# Patient Record
Sex: Male | Born: 1965 | Race: White | Hispanic: No | Marital: Married
Health system: Southern US, Community
[De-identification: ages and names within clinical notes are randomized; demographics above are authoritative.]

---

## 1997-10-13 ENCOUNTER — Encounter: Payer: Self-pay | Admitting: Emergency Medicine

## 1997-10-13 ENCOUNTER — Emergency Department (HOSPITAL_COMMUNITY): Admission: EM | Admit: 1997-10-13 | Discharge: 1997-10-13 | Payer: Self-pay | Admitting: Emergency Medicine

## 1998-01-22 ENCOUNTER — Encounter: Payer: Self-pay | Admitting: Gastroenterology

## 1998-01-26 ENCOUNTER — Encounter: Payer: Self-pay | Admitting: Gastroenterology

## 2003-07-26 ENCOUNTER — Other Ambulatory Visit: Payer: Self-pay

## 2003-09-28 ENCOUNTER — Emergency Department (HOSPITAL_COMMUNITY): Admission: EM | Admit: 2003-09-28 | Discharge: 2003-09-28 | Payer: Self-pay | Admitting: Emergency Medicine

## 2003-10-23 ENCOUNTER — Ambulatory Visit: Payer: Self-pay | Admitting: Pain Medicine

## 2003-11-05 ENCOUNTER — Ambulatory Visit: Payer: Self-pay | Admitting: Pain Medicine

## 2003-11-25 ENCOUNTER — Observation Stay: Payer: Self-pay | Admitting: Internal Medicine

## 2003-12-09 ENCOUNTER — Ambulatory Visit: Payer: Self-pay | Admitting: Pain Medicine

## 2003-12-17 ENCOUNTER — Emergency Department: Payer: Self-pay | Admitting: Emergency Medicine

## 2003-12-21 ENCOUNTER — Emergency Department (HOSPITAL_COMMUNITY): Admission: EM | Admit: 2003-12-21 | Discharge: 2003-12-22 | Payer: Self-pay | Admitting: Emergency Medicine

## 2003-12-22 ENCOUNTER — Inpatient Hospital Stay (HOSPITAL_COMMUNITY): Admission: EM | Admit: 2003-12-22 | Discharge: 2003-12-23 | Payer: Self-pay | Admitting: Psychiatry

## 2003-12-22 ENCOUNTER — Ambulatory Visit: Payer: Self-pay | Admitting: Psychiatry

## 2003-12-24 ENCOUNTER — Ambulatory Visit: Payer: Self-pay | Admitting: Pain Medicine

## 2006-10-27 ENCOUNTER — Emergency Department (HOSPITAL_COMMUNITY): Admission: EM | Admit: 2006-10-27 | Discharge: 2006-10-27 | Payer: Self-pay | Admitting: Emergency Medicine

## 2006-10-31 ENCOUNTER — Emergency Department (HOSPITAL_COMMUNITY): Admission: EM | Admit: 2006-10-31 | Discharge: 2006-10-31 | Payer: Self-pay | Admitting: Emergency Medicine

## 2007-01-24 ENCOUNTER — Other Ambulatory Visit: Payer: Self-pay

## 2007-01-24 ENCOUNTER — Emergency Department: Payer: Self-pay

## 2007-10-13 ENCOUNTER — Emergency Department (HOSPITAL_COMMUNITY): Admission: EM | Admit: 2007-10-13 | Discharge: 2007-10-13 | Payer: Self-pay | Admitting: Emergency Medicine

## 2007-10-16 ENCOUNTER — Emergency Department (HOSPITAL_COMMUNITY): Admission: EM | Admit: 2007-10-16 | Discharge: 2007-10-16 | Payer: Self-pay | Admitting: Emergency Medicine

## 2007-10-17 ENCOUNTER — Emergency Department (HOSPITAL_COMMUNITY): Admission: EM | Admit: 2007-10-17 | Discharge: 2007-10-17 | Payer: Self-pay | Admitting: Emergency Medicine

## 2007-10-18 ENCOUNTER — Emergency Department (HOSPITAL_COMMUNITY): Admission: EM | Admit: 2007-10-18 | Discharge: 2007-10-18 | Payer: Self-pay | Admitting: Emergency Medicine

## 2007-10-21 ENCOUNTER — Emergency Department (HOSPITAL_COMMUNITY): Admission: EM | Admit: 2007-10-21 | Discharge: 2007-10-21 | Payer: Self-pay | Admitting: *Deleted

## 2007-10-22 ENCOUNTER — Ambulatory Visit (HOSPITAL_COMMUNITY): Admission: RE | Admit: 2007-10-22 | Discharge: 2007-10-22 | Payer: Self-pay | Admitting: Urology

## 2007-10-25 ENCOUNTER — Emergency Department (HOSPITAL_COMMUNITY): Admission: EM | Admit: 2007-10-25 | Discharge: 2007-10-25 | Payer: Self-pay | Admitting: Emergency Medicine

## 2007-12-12 ENCOUNTER — Emergency Department (HOSPITAL_COMMUNITY): Admission: EM | Admit: 2007-12-12 | Discharge: 2007-12-12 | Payer: Self-pay | Admitting: *Deleted

## 2007-12-17 ENCOUNTER — Telehealth: Payer: Self-pay | Admitting: Gastroenterology

## 2007-12-24 ENCOUNTER — Emergency Department (HOSPITAL_COMMUNITY): Admission: EM | Admit: 2007-12-24 | Discharge: 2007-12-24 | Payer: Self-pay | Admitting: Emergency Medicine

## 2007-12-26 DIAGNOSIS — K859 Acute pancreatitis without necrosis or infection, unspecified: Secondary | ICD-10-CM

## 2007-12-26 DIAGNOSIS — K219 Gastro-esophageal reflux disease without esophagitis: Secondary | ICD-10-CM

## 2007-12-26 DIAGNOSIS — F1910 Other psychoactive substance abuse, uncomplicated: Secondary | ICD-10-CM

## 2007-12-26 DIAGNOSIS — N200 Calculus of kidney: Secondary | ICD-10-CM | POA: Insufficient documentation

## 2007-12-26 DIAGNOSIS — K449 Diaphragmatic hernia without obstruction or gangrene: Secondary | ICD-10-CM | POA: Insufficient documentation

## 2008-01-03 ENCOUNTER — Ambulatory Visit: Payer: Self-pay | Admitting: Urology

## 2008-01-04 ENCOUNTER — Emergency Department (HOSPITAL_COMMUNITY): Admission: EM | Admit: 2008-01-04 | Discharge: 2008-01-04 | Payer: Self-pay | Admitting: Emergency Medicine

## 2008-01-06 ENCOUNTER — Emergency Department (HOSPITAL_COMMUNITY): Admission: EM | Admit: 2008-01-06 | Discharge: 2008-01-06 | Payer: Self-pay | Admitting: Emergency Medicine

## 2008-01-12 ENCOUNTER — Emergency Department: Payer: Self-pay | Admitting: Emergency Medicine

## 2008-01-19 ENCOUNTER — Emergency Department (HOSPITAL_COMMUNITY): Admission: EM | Admit: 2008-01-19 | Discharge: 2008-01-20 | Payer: Self-pay | Admitting: Emergency Medicine

## 2008-01-20 ENCOUNTER — Emergency Department: Payer: Self-pay | Admitting: Internal Medicine

## 2008-02-26 ENCOUNTER — Ambulatory Visit: Payer: Self-pay | Admitting: Gastroenterology

## 2008-03-03 ENCOUNTER — Telehealth: Payer: Self-pay | Admitting: Gastroenterology

## 2008-03-06 ENCOUNTER — Emergency Department (HOSPITAL_COMMUNITY): Admission: EM | Admit: 2008-03-06 | Discharge: 2008-03-07 | Payer: Self-pay | Admitting: Emergency Medicine

## 2008-03-07 ENCOUNTER — Encounter: Payer: Self-pay | Admitting: Gastroenterology

## 2008-03-12 ENCOUNTER — Emergency Department (HOSPITAL_COMMUNITY): Admission: EM | Admit: 2008-03-12 | Discharge: 2008-03-12 | Payer: Self-pay | Admitting: Emergency Medicine

## 2008-03-17 ENCOUNTER — Emergency Department (HOSPITAL_COMMUNITY): Admission: EM | Admit: 2008-03-17 | Discharge: 2008-03-18 | Payer: Self-pay | Admitting: Emergency Medicine

## 2008-03-18 ENCOUNTER — Emergency Department: Payer: Self-pay | Admitting: Emergency Medicine

## 2008-03-25 ENCOUNTER — Emergency Department (HOSPITAL_COMMUNITY): Admission: EM | Admit: 2008-03-25 | Discharge: 2008-03-25 | Payer: Self-pay | Admitting: Emergency Medicine

## 2008-03-27 ENCOUNTER — Ambulatory Visit: Payer: Self-pay | Admitting: Emergency Medicine

## 2008-03-27 ENCOUNTER — Inpatient Hospital Stay (HOSPITAL_COMMUNITY): Admission: EM | Admit: 2008-03-27 | Discharge: 2008-03-31 | Payer: Self-pay | Admitting: Emergency Medicine

## 2008-05-25 ENCOUNTER — Emergency Department (HOSPITAL_COMMUNITY): Admission: EM | Admit: 2008-05-25 | Discharge: 2008-05-25 | Payer: Self-pay | Admitting: Emergency Medicine

## 2008-05-31 ENCOUNTER — Emergency Department (HOSPITAL_COMMUNITY): Admission: EM | Admit: 2008-05-31 | Discharge: 2008-05-31 | Payer: Self-pay | Admitting: Emergency Medicine

## 2008-06-13 ENCOUNTER — Emergency Department (HOSPITAL_COMMUNITY): Admission: EM | Admit: 2008-06-13 | Discharge: 2008-06-13 | Payer: Self-pay | Admitting: Emergency Medicine

## 2008-06-19 ENCOUNTER — Emergency Department (HOSPITAL_COMMUNITY): Admission: EM | Admit: 2008-06-19 | Discharge: 2008-06-19 | Payer: Self-pay | Admitting: Family Medicine

## 2008-06-27 ENCOUNTER — Inpatient Hospital Stay (HOSPITAL_COMMUNITY): Admission: EM | Admit: 2008-06-27 | Discharge: 2008-06-29 | Payer: Self-pay | Admitting: Emergency Medicine

## 2008-06-28 ENCOUNTER — Ambulatory Visit: Payer: Self-pay | Admitting: *Deleted

## 2008-10-21 ENCOUNTER — Emergency Department (HOSPITAL_COMMUNITY): Admission: EM | Admit: 2008-10-21 | Discharge: 2008-10-21 | Payer: Self-pay | Admitting: Emergency Medicine

## 2008-10-23 ENCOUNTER — Emergency Department (HOSPITAL_COMMUNITY): Admission: EM | Admit: 2008-10-23 | Discharge: 2008-10-23 | Payer: Self-pay | Admitting: Emergency Medicine

## 2009-05-15 ENCOUNTER — Emergency Department (HOSPITAL_COMMUNITY): Admission: EM | Admit: 2009-05-15 | Discharge: 2009-05-16 | Payer: Self-pay | Admitting: Emergency Medicine

## 2009-05-21 ENCOUNTER — Emergency Department (HOSPITAL_COMMUNITY): Admission: EM | Admit: 2009-05-21 | Discharge: 2009-05-21 | Payer: Self-pay | Admitting: Emergency Medicine

## 2009-06-03 ENCOUNTER — Emergency Department: Payer: Self-pay | Admitting: Emergency Medicine

## 2009-06-04 ENCOUNTER — Emergency Department (HOSPITAL_COMMUNITY): Admission: EM | Admit: 2009-06-04 | Discharge: 2009-06-04 | Payer: Self-pay | Admitting: Emergency Medicine

## 2009-06-27 ENCOUNTER — Emergency Department: Payer: Self-pay | Admitting: Emergency Medicine

## 2009-06-30 ENCOUNTER — Emergency Department: Payer: Self-pay | Admitting: Emergency Medicine

## 2009-08-06 ENCOUNTER — Emergency Department: Payer: Self-pay | Admitting: Emergency Medicine

## 2009-08-12 ENCOUNTER — Emergency Department: Payer: Self-pay | Admitting: Emergency Medicine

## 2009-12-07 ENCOUNTER — Emergency Department (HOSPITAL_COMMUNITY): Admission: EM | Admit: 2009-12-07 | Discharge: 2009-12-07 | Payer: Self-pay | Admitting: Emergency Medicine

## 2009-12-12 ENCOUNTER — Emergency Department: Payer: Self-pay | Admitting: Emergency Medicine

## 2010-03-30 LAB — CBC
HCT: 40 % (ref 39.0–52.0)
Hemoglobin: 12.6 g/dL — ABNORMAL LOW (ref 13.0–17.0)
MCHC: 31.5 g/dL (ref 30.0–36.0)
MCV: 75.3 fL — ABNORMAL LOW (ref 78.0–100.0)
Platelets: 206 10*3/uL (ref 150–400)
RBC: 5.31 MIL/uL (ref 4.22–5.81)
RDW: 16.1 % — ABNORMAL HIGH (ref 11.5–15.5)
WBC: 6.8 10*3/uL (ref 4.0–10.5)

## 2010-03-30 LAB — DIFFERENTIAL
Eosinophils Absolute: 0.3 10*3/uL (ref 0.0–0.7)
Lymphocytes Relative: 31 % (ref 12–46)
Lymphs Abs: 2.1 10*3/uL (ref 0.7–4.0)
Monocytes Absolute: 0.8 10*3/uL (ref 0.1–1.0)

## 2010-03-30 LAB — BASIC METABOLIC PANEL
CO2: 26 mEq/L (ref 19–32)
Creatinine, Ser: 1.1 mg/dL (ref 0.4–1.5)
GFR calc non Af Amer: >60 mL/min (ref >60)
Potassium: 4.2 mEq/L (ref 3.5–5.1)
Sodium: 138 mEq/L (ref 135–145)

## 2010-03-30 LAB — PROTIME-INR
INR: 0.98 (ref 0.00–1.49)
Prothrombin Time: 13.2 seconds (ref 11.6–15.2)

## 2010-03-30 LAB — APTT: aPTT: 26 seconds (ref 24–37)

## 2010-04-05 LAB — RAPID URINE DRUG SCREEN, HOSP PERFORMED
Amphetamines: NOT DETECTED
Barbiturates: NOT DETECTED
Benzodiazepines: NOT DETECTED
Cocaine: POSITIVE — AB
Opiates: POSITIVE — AB
Tetrahydrocannabinol: NOT DETECTED

## 2010-04-05 LAB — CBC
HCT: 39.6 % (ref 39.0–52.0)
Hemoglobin: 13.2 g/dL (ref 13.0–17.0)
MCHC: 33.5 g/dL (ref 30.0–36.0)
MCV: 83.8 fL (ref 78.0–100.0)
RBC: 4.72 MIL/uL (ref 4.22–5.81)
WBC: 5.3 10*3/uL (ref 4.0–10.5)

## 2010-04-05 LAB — DIFFERENTIAL
Basophils Absolute: 0 10*3/uL (ref 0.0–0.1)
Eosinophils Absolute: 0.5 10*3/uL (ref 0.0–0.7)
Eosinophils Relative: 9 % — ABNORMAL HIGH (ref 0–5)
Lymphocytes Relative: 32 % (ref 12–46)
Lymphs Abs: 1.7 10*3/uL (ref 0.7–4.0)
Neutrophils Relative %: 49 % (ref 43–77)

## 2010-04-05 LAB — URINALYSIS, ROUTINE W REFLEX MICROSCOPIC
Glucose, UA: NEGATIVE mg/dL
Hgb urine dipstick: NEGATIVE
Ketones, ur: NEGATIVE mg/dL
Nitrite: NEGATIVE
pH: 5 (ref 5.0–8.0)

## 2010-04-05 LAB — URINE MICROSCOPIC-ADD ON

## 2010-04-05 LAB — COMPREHENSIVE METABOLIC PANEL
AST: 25 U/L (ref 0–37)
CO2: 25 mEq/L (ref 19–32)
Calcium: 8.6 mg/dL (ref 8.4–10.5)
Chloride: 103 mEq/L (ref 96–112)
Creatinine, Ser: 1.26 mg/dL (ref 0.4–1.5)
GFR calc non Af Amer: >60 mL/min (ref >60)
Glucose, Bld: 91 mg/dL (ref 70–99)
Total Bilirubin: 0.7 mg/dL (ref 0.3–1.2)

## 2010-04-05 LAB — LIPASE, BLOOD: Lipase: 19 U/L (ref 11–59)

## 2010-04-06 LAB — URINALYSIS, ROUTINE W REFLEX MICROSCOPIC
Bilirubin Urine: NEGATIVE
Bilirubin Urine: NEGATIVE
Glucose, UA: NEGATIVE mg/dL
Ketones, ur: NEGATIVE mg/dL
Ketones, ur: NEGATIVE mg/dL
Leukocytes, UA: NEGATIVE
Nitrite: NEGATIVE
Nitrite: NEGATIVE
Specific Gravity, Urine: 1.02 (ref 1.005–1.030)
Specific Gravity, Urine: 1.023 (ref 1.005–1.030)
Urobilinogen, UA: 1 mg/dL (ref 0.0–1.0)
pH: 6 (ref 5.0–8.0)
pH: 7.5 (ref 5.0–8.0)

## 2010-04-06 LAB — POCT I-STAT, CHEM 8
Calcium, Ion: 1.16 mmol/L (ref 1.12–1.32)
Chloride: 107 mEq/L (ref 96–112)
Creatinine, Ser: 1.1 mg/dL (ref 0.4–1.5)
Creatinine, Ser: 1.2 mg/dL (ref 0.4–1.5)
Glucose, Bld: 95 mg/dL (ref 70–99)
HCT: 45 % (ref 39.0–52.0)
Hemoglobin: 15.3 g/dL (ref 13.0–17.0)
Potassium: 3.9 mEq/L (ref 3.5–5.1)
Sodium: 143 mEq/L (ref 135–145)
TCO2: 26 mmol/L (ref 0–100)

## 2010-04-06 LAB — URINE MICROSCOPIC-ADD ON

## 2010-04-22 LAB — URINE MICROSCOPIC-ADD ON

## 2010-04-22 LAB — DIFFERENTIAL
Basophils Absolute: 0.1 10*3/uL (ref 0.0–0.1)
Basophils Relative: 2 % — ABNORMAL HIGH (ref 0–1)
Eosinophils Relative: 11 % — ABNORMAL HIGH (ref 0–5)
Lymphocytes Relative: 19 % (ref 12–46)
Lymphs Abs: 1.6 10*3/uL (ref 0.7–4.0)
Lymphs Abs: 1.6 10*3/uL (ref 0.7–4.0)
Monocytes Absolute: 0.7 10*3/uL (ref 0.1–1.0)
Monocytes Relative: 8 % (ref 3–12)

## 2010-04-22 LAB — COMPREHENSIVE METABOLIC PANEL
ALT: 19 U/L (ref 0–53)
ALT: 21 U/L (ref 0–53)
AST: 30 U/L (ref 0–37)
BUN: 6 mg/dL (ref 6–23)
CO2: 26 mEq/L (ref 19–32)
CO2: 29 mEq/L (ref 19–32)
Calcium: 8.4 mg/dL (ref 8.4–10.5)
Calcium: 9 mg/dL (ref 8.4–10.5)
Creatinine, Ser: 1.09 mg/dL (ref 0.4–1.5)
GFR calc Af Amer: >60 mL/min (ref >60)
GFR calc non Af Amer: >60 mL/min (ref >60)
GFR calc non Af Amer: >60 mL/min (ref >60)
Glucose, Bld: 95 mg/dL (ref 70–99)
Potassium: 4.5 mEq/L (ref 3.5–5.1)
Sodium: 141 mEq/L (ref 135–145)
Total Protein: 7 g/dL (ref 6.0–8.3)

## 2010-04-22 LAB — URINALYSIS, ROUTINE W REFLEX MICROSCOPIC
Hgb urine dipstick: NEGATIVE
Protein, ur: 30 mg/dL — AB
Urobilinogen, UA: 1 mg/dL (ref 0.0–1.0)

## 2010-04-22 LAB — CBC
Hemoglobin: 11 g/dL — ABNORMAL LOW (ref 13.0–17.0)
MCHC: 29.8 g/dL — ABNORMAL LOW (ref 30.0–36.0)
MCHC: 30.4 g/dL (ref 30.0–36.0)
MCV: 66 fL — ABNORMAL LOW (ref 78.0–100.0)
RBC: 5.49 MIL/uL (ref 4.22–5.81)
RBC: 6.18 MIL/uL — ABNORMAL HIGH (ref 4.22–5.81)

## 2010-04-22 LAB — GLUCOSE, CAPILLARY: Glucose-Capillary: 105 mg/dL — ABNORMAL HIGH (ref 70–99)

## 2010-04-24 IMAGING — CR DG ABDOMEN 1V
1 series · 2 of 2 positions shown · non-contrast
Comparison: none

REASON FOR EXAM: Renal calculi-lithotripsy
COMMENTS:

[Series 1: view not recorded · 0.17mm/px · 2 of 2 slices shown]
[im 1/2]
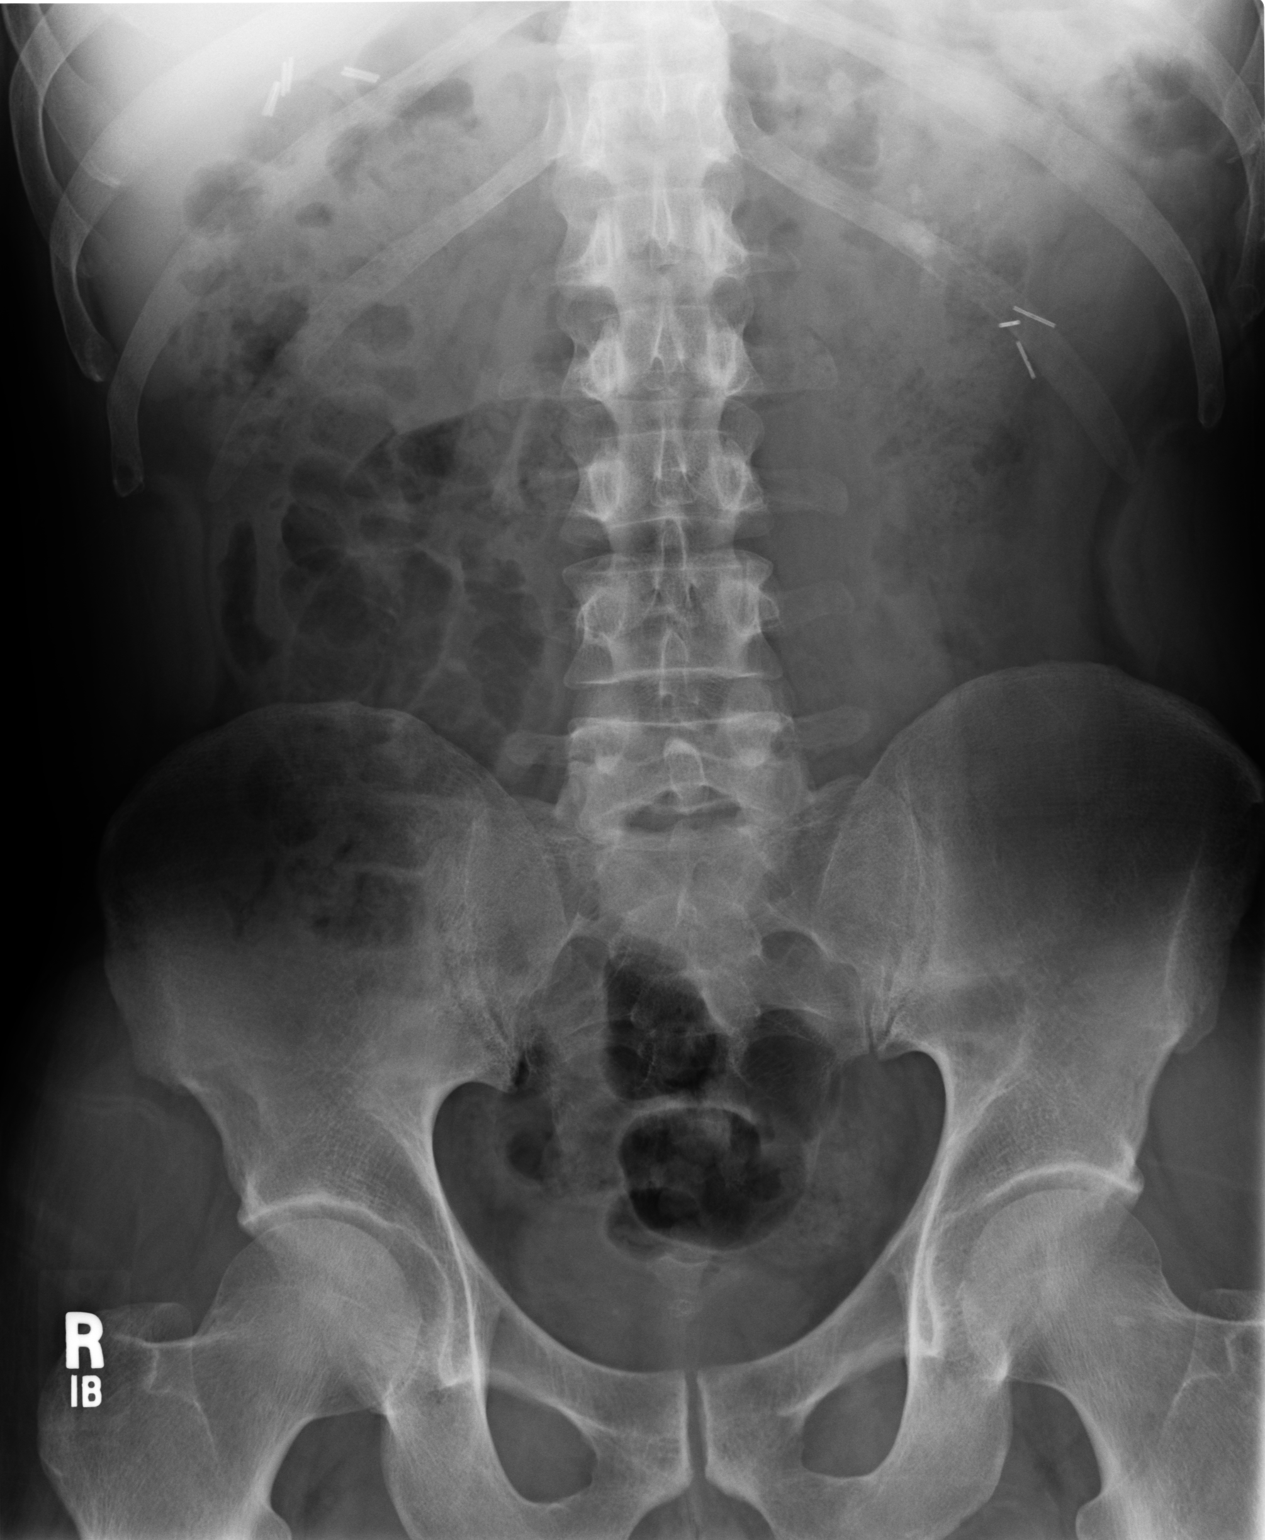
[im 2/2]
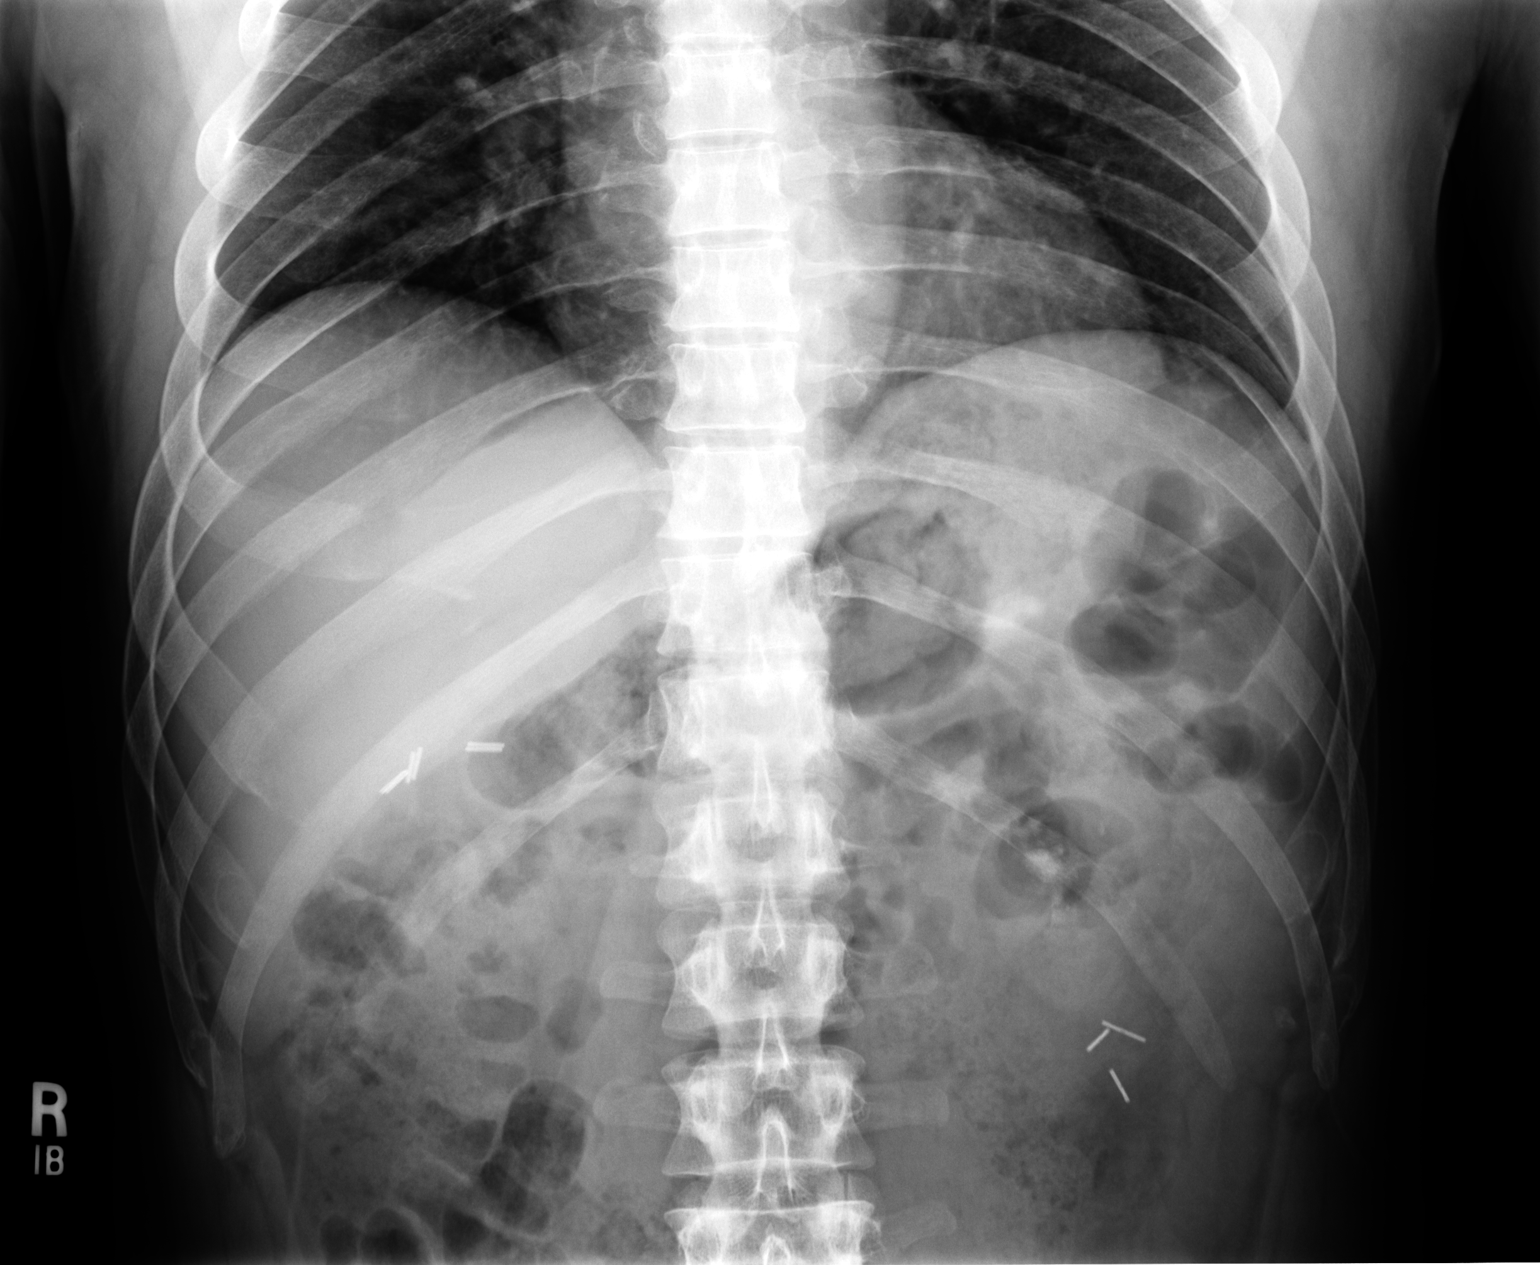

[2 of 2 positions shown; findings below may reference images not displayed]

PROCEDURE:     DXR - DXR KIDNEY URETER BLADDER  - January 03, 2008  [DATE]

RESULT:     Surgical clips project over the left mid abdomen. There are
multiple densities in the region of the left renal collecting system
consistent with multiple left renal stones. Cholecystectomy clips are
present. No definite ureteral stones or right renal calculi are evident. The
bowel gas pattern is nonspecific with some air seen in nondistended loops of
small bowel. The lung bases are clear.
IMPRESSION: Nephrolithiasis on the left.

## 2010-04-26 LAB — BASIC METABOLIC PANEL
CO2: 27 mEq/L (ref 19–32)
Chloride: 105 mEq/L (ref 96–112)
Glucose, Bld: 119 mg/dL — ABNORMAL HIGH (ref 70–99)
Potassium: 3.1 mEq/L — ABNORMAL LOW (ref 3.5–5.1)
Sodium: 139 mEq/L (ref 135–145)

## 2010-04-26 LAB — CBC
HCT: 28.5 % — ABNORMAL LOW (ref 39.0–52.0)
Hemoglobin: 9.2 g/dL — ABNORMAL LOW (ref 13.0–17.0)
Hemoglobin: 9.8 g/dL — ABNORMAL LOW (ref 13.0–17.0)
MCHC: 32.2 g/dL (ref 30.0–36.0)
MCHC: 32.2 g/dL (ref 30.0–36.0)
MCV: 73.7 fL — ABNORMAL LOW (ref 78.0–100.0)
RBC: 4.14 MIL/uL — ABNORMAL LOW (ref 4.22–5.81)
RDW: 18.2 % — ABNORMAL HIGH (ref 11.5–15.5)

## 2010-04-26 LAB — DIFFERENTIAL
Basophils Relative: 0 % (ref 0–1)
Eosinophils Absolute: 0 10*3/uL (ref 0.0–0.7)
Eosinophils Relative: 1 % (ref 0–5)
Lymphs Abs: 0.8 10*3/uL (ref 0.7–4.0)
Monocytes Relative: 5 % (ref 3–12)

## 2010-04-26 LAB — IRON AND TIBC: UIBC: 549 ug/dL

## 2010-04-26 LAB — COMPREHENSIVE METABOLIC PANEL
ALT: 15 U/L (ref 0–53)
AST: 26 U/L (ref 0–37)
Alkaline Phosphatase: 80 U/L (ref 39–117)
CO2: 27 mEq/L (ref 19–32)
Calcium: 9.4 mg/dL (ref 8.4–10.5)
GFR calc Af Amer: >60 mL/min (ref >60)
GFR calc non Af Amer: >60 mL/min (ref >60)
Potassium: 4 mEq/L (ref 3.5–5.1)
Sodium: 140 mEq/L (ref 135–145)
Total Protein: 7.3 g/dL (ref 6.0–8.3)

## 2010-04-26 LAB — URINALYSIS, ROUTINE W REFLEX MICROSCOPIC
Leukocytes, UA: NEGATIVE
Nitrite: NEGATIVE
Specific Gravity, Urine: 1.021 (ref 1.005–1.030)
Urobilinogen, UA: 0.2 mg/dL (ref 0.0–1.0)
pH: 6.5 (ref 5.0–8.0)

## 2010-04-26 LAB — GABAPENTIN LEVEL: Gabapentin Lvl: 3.7 ug/mL (ref 2.0–10.0)

## 2010-04-26 LAB — URINE MICROSCOPIC-ADD ON

## 2010-04-26 LAB — RAPID URINE DRUG SCREEN, HOSP PERFORMED
Amphetamines: NOT DETECTED
Opiates: NOT DETECTED
Tetrahydrocannabinol: NOT DETECTED

## 2010-04-26 LAB — FOLATE: Folate: >20 ng/mL

## 2010-04-26 LAB — MAGNESIUM: Magnesium: 2.3 mg/dL (ref 1.5–2.5)

## 2010-04-26 LAB — ETHANOL: Alcohol, Ethyl (B): <5 mg/dL (ref 0–10)

## 2010-04-26 LAB — T4, FREE: Free T4: 1.02 ng/dL (ref 0.80–1.80)

## 2010-04-27 LAB — COMPREHENSIVE METABOLIC PANEL
ALT: 14 U/L (ref 0–53)
ALT: 12 U/L (ref 0–53)
AST: 21 U/L (ref 0–37)
AST: 15 U/L (ref 0–37)
Albumin: 2.9 g/dL — ABNORMAL LOW (ref 3.5–5.2)
Alkaline Phosphatase: 85 U/L (ref 39–117)
Alkaline Phosphatase: 78 U/L (ref 39–117)
CO2: 28 mEq/L (ref 19–32)
Calcium: 8.9 mg/dL (ref 8.4–10.5)
Calcium: 8.6 mg/dL (ref 8.4–10.5)
GFR calc Af Amer: >60 mL/min (ref >60)
GFR calc Af Amer: >60 mL/min (ref >60)
Glucose, Bld: 96 mg/dL (ref 70–99)
Glucose, Bld: 112 mg/dL — ABNORMAL HIGH (ref 70–99)
Potassium: 3.7 mEq/L (ref 3.5–5.1)
Potassium: 3.9 mEq/L (ref 3.5–5.1)
Sodium: 138 mEq/L (ref 135–145)
Sodium: 145 mEq/L (ref 135–145)
Total Protein: 7 g/dL (ref 6.0–8.3)
Total Protein: 6.2 g/dL (ref 6.0–8.3)

## 2010-04-27 LAB — CBC
HCT: 28.9 % — ABNORMAL LOW (ref 39.0–52.0)
Hemoglobin: 10.1 g/dL — ABNORMAL LOW (ref 13.0–17.0)
MCHC: 32.1 g/dL (ref 30.0–36.0)
MCHC: 32.3 g/dL (ref 30.0–36.0)
MCV: 81.8 fL (ref 78.0–100.0)
Platelets: 277 10*3/uL (ref 150–400)
RBC: 4.14 MIL/uL — ABNORMAL LOW (ref 4.22–5.81)
RDW: 16 % — ABNORMAL HIGH (ref 11.5–15.5)

## 2010-04-27 LAB — URINE MICROSCOPIC-ADD ON

## 2010-04-27 LAB — URINALYSIS, ROUTINE W REFLEX MICROSCOPIC
Glucose, UA: NEGATIVE mg/dL
Ketones, ur: NEGATIVE mg/dL
Leukocytes, UA: NEGATIVE
Protein, ur: 30 mg/dL — AB
pH: 6 (ref 5.0–8.0)

## 2010-04-27 LAB — DIFFERENTIAL
Basophils Relative: 1 % (ref 0–1)
Basophils Relative: 1 % (ref 0–1)
Eosinophils Absolute: 0.2 10*3/uL (ref 0.0–0.7)
Eosinophils Absolute: 0.2 10*3/uL (ref 0.0–0.7)
Eosinophils Relative: 3 % (ref 0–5)
Eosinophils Relative: 5 % (ref 0–5)
Lymphs Abs: 2.3 10*3/uL (ref 0.7–4.0)
Lymphs Abs: 1.7 10*3/uL (ref 0.7–4.0)
Monocytes Absolute: 0.5 10*3/uL (ref 0.1–1.0)
Monocytes Relative: 9 % (ref 3–12)
Monocytes Relative: 9 % (ref 3–12)
Neutrophils Relative %: 42 % — ABNORMAL LOW (ref 43–77)
Neutrophils Relative %: 53 % (ref 43–77)

## 2010-04-29 LAB — BASIC METABOLIC PANEL
BUN: 5 mg/dL — ABNORMAL LOW (ref 6–23)
CO2: 26 mEq/L (ref 19–32)
CO2: 27 mEq/L (ref 19–32)
CO2: 27 mEq/L (ref 19–32)
Chloride: 107 mEq/L (ref 96–112)
Chloride: 109 mEq/L (ref 96–112)
Chloride: 110 mEq/L (ref 96–112)
Creatinine, Ser: 0.68 mg/dL (ref 0.4–1.5)
Creatinine, Ser: 0.8 mg/dL (ref 0.4–1.5)
GFR calc Af Amer: >60 mL/min (ref >60)
GFR calc Af Amer: >60 mL/min (ref >60)
Glucose, Bld: 87 mg/dL (ref 70–99)
Potassium: 3.3 mEq/L — ABNORMAL LOW (ref 3.5–5.1)
Sodium: 140 mEq/L (ref 135–145)

## 2010-04-29 LAB — TROPONIN I: Troponin I: <0.01 ng/mL (ref 0.00–0.06)

## 2010-04-29 LAB — CARDIAC PANEL(CRET KIN+CKTOT+MB+TROPI)
CK, MB: 1.1 ng/mL (ref 0.3–4.0)
CK, MB: 1.4 ng/mL (ref 0.3–4.0)
Total CK: 293 U/L — ABNORMAL HIGH (ref 7–232)
Total CK: 317 U/L — ABNORMAL HIGH (ref 7–232)
Troponin I: 0.07 ng/mL — ABNORMAL HIGH (ref 0.00–0.06)

## 2010-04-29 LAB — DIFFERENTIAL
Basophils Absolute: 0 10*3/uL (ref 0.0–0.1)
Basophils Absolute: 0 10*3/uL (ref 0.0–0.1)
Eosinophils Relative: 1 % (ref 0–5)
Lymphocytes Relative: 35 % (ref 12–46)
Lymphocytes Relative: 8 % — ABNORMAL LOW (ref 12–46)
Lymphs Abs: 0.7 10*3/uL (ref 0.7–4.0)
Lymphs Abs: 2.1 10*3/uL (ref 0.7–4.0)
Monocytes Absolute: 0.3 10*3/uL (ref 0.1–1.0)
Neutro Abs: 3.3 10*3/uL (ref 1.7–7.7)

## 2010-04-29 LAB — POCT I-STAT 3, ART BLOOD GAS (G3+)
Acid-Base Excess: 1 mmol/L (ref 0.0–2.0)
Acid-Base Excess: 2 mmol/L (ref 0.0–2.0)
O2 Saturation: 100 %
O2 Saturation: 97 %
Patient temperature: 97.9
TCO2: 27 mmol/L (ref 0–100)
pCO2 arterial: 35.2 mmHg (ref 35.0–45.0)
pCO2 arterial: 37.7 mmHg (ref 35.0–45.0)
pH, Arterial: 7.442 (ref 7.350–7.450)

## 2010-04-29 LAB — CBC
HCT: 27.5 % — ABNORMAL LOW (ref 39.0–52.0)
HCT: 32.7 % — ABNORMAL LOW (ref 39.0–52.0)
HCT: 39.3 % (ref 39.0–52.0)
Hemoglobin: 11.1 g/dL — ABNORMAL LOW (ref 13.0–17.0)
Hemoglobin: 9.7 g/dL — ABNORMAL LOW (ref 13.0–17.0)
MCHC: 33 g/dL (ref 30.0–36.0)
MCHC: 33.1 g/dL (ref 30.0–36.0)
MCHC: 33.5 g/dL (ref 30.0–36.0)
MCHC: 33.8 g/dL (ref 30.0–36.0)
MCV: 84.4 fL (ref 78.0–100.0)
MCV: 84.6 fL (ref 78.0–100.0)
MCV: 85.3 fL (ref 78.0–100.0)
MCV: 85.8 fL (ref 78.0–100.0)
Platelets: 130 10*3/uL — ABNORMAL LOW (ref 150–400)
Platelets: 156 10*3/uL (ref 150–400)
Platelets: 157 10*3/uL (ref 150–400)
Platelets: 222 10*3/uL (ref 150–400)
RBC: 3.37 MIL/uL — ABNORMAL LOW (ref 4.22–5.81)
RBC: 3.84 MIL/uL — ABNORMAL LOW (ref 4.22–5.81)
RBC: 4.61 MIL/uL (ref 4.22–5.81)
RDW: 16.6 % — ABNORMAL HIGH (ref 11.5–15.5)
WBC: 5.1 10*3/uL (ref 4.0–10.5)
WBC: 6.1 10*3/uL (ref 4.0–10.5)
WBC: 7.3 10*3/uL (ref 4.0–10.5)
WBC: 8.8 10*3/uL (ref 4.0–10.5)

## 2010-04-29 LAB — TRIGLYCERIDES: Triglycerides: 237 mg/dL — ABNORMAL HIGH (ref <150)

## 2010-04-29 LAB — VITAMIN B12: Vitamin B-12: 342 pg/mL (ref 211–911)

## 2010-04-29 LAB — PROTIME-INR
INR: 1 (ref 0.00–1.49)
Prothrombin Time: 13.4 seconds (ref 11.6–15.2)

## 2010-04-29 LAB — COMPREHENSIVE METABOLIC PANEL
AST: 24 U/L (ref 0–37)
Albumin: 3.5 g/dL (ref 3.5–5.2)
BUN: 4 mg/dL — ABNORMAL LOW (ref 6–23)
CO2: 29 mEq/L (ref 19–32)
Calcium: 9.5 mg/dL (ref 8.4–10.5)
Chloride: 100 mEq/L (ref 96–112)
Chloride: 105 mEq/L (ref 96–112)
Creatinine, Ser: 0.8 mg/dL (ref 0.4–1.5)
Creatinine, Ser: 0.86 mg/dL (ref 0.4–1.5)
GFR calc Af Amer: >60 mL/min (ref >60)
GFR calc non Af Amer: >60 mL/min (ref >60)
Sodium: 139 mEq/L (ref 135–145)
Total Bilirubin: 0.8 mg/dL (ref 0.3–1.2)
Total Bilirubin: 0.9 mg/dL (ref 0.3–1.2)

## 2010-04-29 LAB — AMMONIA: Ammonia: 18 umol/L (ref 11–35)

## 2010-04-29 LAB — RAPID URINE DRUG SCREEN, HOSP PERFORMED
Amphetamines: NOT DETECTED
Barbiturates: NOT DETECTED
Benzodiazepines: POSITIVE — AB
Benzodiazepines: POSITIVE — AB
Cocaine: NOT DETECTED
Tetrahydrocannabinol: NOT DETECTED

## 2010-04-29 LAB — POCT CARDIAC MARKERS

## 2010-04-29 LAB — POCT I-STAT 3, VENOUS BLOOD GAS (G3P V)
TCO2: 23 mmol/L (ref 0–100)
pH, Ven: 7.442 — ABNORMAL HIGH (ref 7.250–7.300)

## 2010-04-29 LAB — ACETAMINOPHEN LEVEL
Acetaminophen (Tylenol), Serum: <10 ug/mL — ABNORMAL LOW (ref 10–30)
Acetaminophen (Tylenol), Serum: <10 ug/mL — ABNORMAL LOW (ref 10–30)

## 2010-04-29 LAB — GLUCOSE, CAPILLARY
Glucose-Capillary: 100 mg/dL — ABNORMAL HIGH (ref 70–99)
Glucose-Capillary: 72 mg/dL (ref 70–99)

## 2010-04-29 LAB — MAGNESIUM: Magnesium: 1.9 mg/dL (ref 1.5–2.5)

## 2010-04-29 LAB — CK TOTAL AND CKMB (NOT AT ARMC): Relative Index: 0.4 (ref 0.0–2.5)

## 2010-04-29 LAB — CHOLESTEROL, TOTAL: Cholesterol: 131 mg/dL (ref 0–200)

## 2010-04-29 LAB — FOLATE: Folate: 14.3 ng/mL

## 2010-05-03 LAB — URINALYSIS, ROUTINE W REFLEX MICROSCOPIC
Glucose, UA: NEGATIVE mg/dL
Leukocytes, UA: NEGATIVE
Nitrite: NEGATIVE
Protein, ur: NEGATIVE mg/dL
pH: 7 (ref 5.0–8.0)

## 2010-05-03 LAB — POCT I-STAT, CHEM 8
Calcium, Ion: 1.12 mmol/L (ref 1.12–1.32)
Chloride: 104 mEq/L (ref 96–112)
HCT: 43 % (ref 39.0–52.0)
Hemoglobin: 14.6 g/dL (ref 13.0–17.0)
Potassium: 3 mEq/L — ABNORMAL LOW (ref 3.5–5.1)

## 2010-05-03 LAB — URINE MICROSCOPIC-ADD ON

## 2010-05-04 LAB — URINALYSIS, ROUTINE W REFLEX MICROSCOPIC
Ketones, ur: NEGATIVE mg/dL
Leukocytes, UA: NEGATIVE
Nitrite: NEGATIVE
Urobilinogen, UA: 0.2 mg/dL (ref 0.0–1.0)
pH: 5 (ref 5.0–8.0)

## 2010-05-04 LAB — URINE MICROSCOPIC-ADD ON

## 2010-06-01 NOTE — Discharge Summary (Signed)
[name], [name]                ACCOUNT NO.:  [id number]   MEDICAL RECORD NO.:  [id number]          PATIENT TYPE:  INP   LOCATION:  5507                         FACILITY:  [facility]   PHYSICIAN:  Altha Harm, MDDATE OF BIRTH:  [birth year]   DATE OF ADMISSION:  06/27/2008  DATE OF DISCHARGE:  06/29/2008                               DISCHARGE SUMMARY   DISCHARGE DISPOSITION:  Home.   FINAL DISCHARGE DIAGNOSES:  1. Neurontin toxicity.  2. Movement disorder.  3. Posttraumatic stress disorder.  4. Chronic pain.  5. Anxiety.  6. Depression.   DISCHARGE MEDICATIONS:  Include the following:  1. Paxil 30 mg p.o. daily.  2. Prilosec 20 mg p.o. daily.  3. Soma 350 mg p.o. b.i.d.  4. Tramadol 50 mg p.o. b.i.d.   CONSULTATIONS:  Jasmine Pang, M.D., psychiatry.   PROCEDURES:  None.   DIAGNOSTIC STUDIES:  A CT scan of the head without contrast on admission  which showed no acute intracranial abnormalities.   ALLERGIES:  1. OPIATE NARCOTICS.  2. DOXYCYCLINE.  3. DILANTIN.   CODE STATUS:  Full code.   PRIMARY CARE PHYSICIAN:  Unassigned to [place] here in [place]. The  patient sees Dr. Timoteo Gaul at [facility].   CHIEF COMPLAINT:  Abnormal movement x1 day.   HISTORY OF PRESENT ILLNESS:  Please refer to the H and P by Dr. Orvan Falconer  for details of the HPI.   HOSPITAL COURSE:  The patient was admitted to the hospital with abnormal  movement.  He was placed on telemetry for observation for any cardiac  causes. However, the patient displayed no arrhythmias.  It was felt that  this was secondary to chronic Neurontin toxicity, as the patient had  been taking in excess of 7000 mg daily. The Neurontin was discontinued.  I consulted Dr. Milford Cage in light of the fact that the patient does  have a history of PTSD, anxiety, and depression, for assistance with  medication.  Dr. Katrinka Blazing agreed with discontinuing the Neurontin, and she  also recommended that Paxil be  continued at a reduced dose of 30 mg  daily.  She also recommended that the patient follow up in outpatient  psychiatry for further evaluation for bipolar disorder and for  management of his medications. The patient had been seeing a  psychiatrist in the [facility] region, Dr. Imogene Burn, but he states that  he does not want to return to his care.  The patient has been offered  the option of following up in [facility],  and the number has been provided to the patient in the form of [phone].  The patient today is without any abnormalities of movement. He is  ambulating without any difficulty.  The patient is alert and oriented  x3.  He has good insight and cognition and is lucid in his thought  process.  And he had no physical abnormalities on examination.   Physical limitations are none.   Dietary limitations are none.   FOLLOW UP:  The patient is to follow up with Dr. Timoteo Gaul, his primary  care physician, as needed and with [facility] within 3-5 days.   Total time for this discharge process 37 minutes.      Altha Harm, MD  Electronically Signed     MAM/MEDQ  D:  06/29/2008  T:  06/29/2008  Job:  [id number]   cc:   Donella Stade, M.D.

## 2010-06-01 NOTE — Discharge Summary (Signed)
Peter [name], Peter [name]                ACCOUNT NO.:  [id number]   MEDICAL RECORD NO.:  [id number]          [name] TYPE:  INP   LOCATION:  3030                         FACILITY:  [facility]   PHYSICIAN:  Felipa Evener, MD  DATE OF BIRTH:  1965-02-17   DATE OF ADMISSION:  03/27/2008  DATE OF DISCHARGE:  03/31/2008                               DISCHARGE SUMMARY   DISCHARGE DIAGNOSES:  1. Ventilatory dependent respiratory failure secondary to acute      altered mental status.  2. Hypertension.  3. Anemia.   HISTORY OF PRESENT ILLNESS:  Peter [name] is a 45 year old male with  significant history of substance abuse, chronic pancreatitis, and  depression who presented to Peter ER on 03/11 after several previous  visits to Peter ER with complaints of tremors, nausea, not feeling well,  and complaint of withdrawal.  Peter [name] presented again on 03/11 acutely  accompanied by Peter [name] family who stated earlier in Peter day Peter [name] complained of  not feeling well, of tremors, nausea, and they had noticed progressive  altered mental status.  On initial arrival to Peter ER, Peter [name] was found to be  unresponsive and obtunded and required immediate intubation.  It was  unknown by Peter family whether Peter [name] had taken an overdose of any medication  or not but did state that Peter [name] has been withdrawing over Peter past several  days from polysubstance abuse.   LABORATORY DATA:  Triglycerides 90, phosphorus 3.2, magnesium 1.9.  Basic metabolic panel:  Sodium 148, potassium 3.7, glucose 93, BUN 6,  creatinine 0.68.  CBC:  White blood cells 4.8, hemoglobin 9.7,  hematocrit 28.5, platelets 140.  TSH 0.341.  Syphilis screen:  RPR was  nonreactive.  Urine drug screen was positive for benzos. Cardiac panel:  CK-MB less than 1, troponin less than 0.5.  Aspirin level was less than  4.  Tylenol level was less than 10.   RADIOLOGY DATA:  Portable chest x-ray on admission showed stable lungs,  no pneumothorax, no pulmonary edema or acute air space  opacity.  CT head  on admission showed no evidence for acute infarct.   PROCEDURES:  Peter [name] had a left IJ triple lumen central line placed  on 03/12 for IV access and assessment of intravascular volume mass and  this was removed on 03/13.  Peter [name] also had a right radial A-line  placed on 03/12 for assessment of blood pressure and frequent  blood  sampling and this was removed on 03/13.   HOSPITAL COURSE BY DISCHARGE DIAGNOSES:  1. Vent dependent respiratory failure secondary to altered mental      status.  Peter [name] did require intubation on admission and was      intubated for overnight from 03/12 early in Peter morning until 03/12      afternoon.  This was felt to be strictly related to obtundation      secondary to altered mental status.  Unknown, however is whether      this was due to withdrawal syndrome versus any questionable      overdose.  Again, urine drug screen  was positive only for benzos.      Dr. Jeanie Sewer of psychiatry was consulted during this admission and      it was felt that this was not a suicide attempt.  Apparently, Peter      [name] did tell Peter [name] mother that Peter [name] wanted to die but this was a      figure of speech during Peter [name] episodes of nausea and vomiting.  Dr.      Jeanie Sewer recommended Paxil, Abilify, hydroxyzine as needed for      acute anxiety, and recommended that Peter [name] attend intensive      outpatient substance abuse counseling.  Peter [name]'s respiratory      status quickly improved once Peter [name] was extubated.  Peter [name] quickly      saturated well on room air once Peter [name] mental status improved as did      Peter [name] respiratory status.  Peter vent dependent respiratory failure was      resolved at Peter time of discharge.  2. Hypertension.  Peter [name]'s hypertension was improved with      medications during Peter [name] admission and Peter [name] was discharged on      lisinopril.  This is a chronic problem for this [name] and Peter [name] was      to follow up with Peter [name] primary care  physician on an outpatient      basis.  3. Anemia.  This was of unknown etiology, however, it was not severe.      Peter [name] hemoglobin prior to discharge was 9.7.  Peter [name] did not have any      active bleeding.  Peter [name] was on subcu heparin for DVT      prophylaxis during this admission.  However, Peter [name] hemoglobin on      admission was 13.0.  a HIT panel was done on 04/13 was negative for      heparin antibody.  Peter [name] heparin was stopped and Peter [name] was switched PAS      hose for DVT prophylaxis.  Again, this was an unknown etiology and      it was not severe.  Peter [name] had no active bleeding.  This was      to be followed up on an outpatient basis.   DISCHARGE MEDICATIONS:  1. Paxil 20 mg q.a.m., then titrating over Peter next 7 days up to 60 mg      p.o. q.a.m.  2. Prilosec p.o. daily.  3. Lisinopril 10 mg p.o. daily.  4. Alprazolam 1 mg p.o. b.i.d. as needed.  5. Pancrease 2 caps p.o. before meals.   DISCHARGE DIET:  Peter [name] was discharged on a regular diet.   ACTIVITY:  Peter [name] was to increase activity as tolerated.   FOLLOWUP:  Peter [name] was to follow up with Peter [name] primary care physician,  as well as attend intensive outpatient substance abuse counseling.   Peter [name] was discharged home.  Peter [name] vent respiratory failure was  resolved.  Peter [name] altered mental status was resolved.  Peter [name] polysubstance  abuse is an ongoing issue when Peter [name] spoke with psychiatry during  this admission and was willing to attend  intensive outpatient therapy.      Dirk Dress, NP      Peter Ditch Jefm Miles, MD  Electronically Signed    KW/MEDQ  D:  05/14/2008  T:  05/14/2008  Job:  [id number]   cc:   Antonietta Breach, M.D.

## 2010-06-01 NOTE — Consult Note (Signed)
NAMECRUZE, ZINGARO NO.:  [id number]   MEDICAL RECORD NO.:  [id number]          PATIENT TYPE:  INP   LOCATION:  2108                         FACILITY:  [facility]   PHYSICIAN:  Antonietta Breach, M.D.  DATE OF BIRTH:  Jan 13, 1966   DATE OF CONSULTATION:  03/28/2008  DATE OF DISCHARGE:                                 CONSULTATION   REQUESTING PHYSICIAN:  Charlaine Dalton. Sherene Sires, MD, FCCP.   REASON FOR CONSULTATION:  Delirium, substance withdrawal, overdose,  suicide attempt.   HISTORY OF PRESENT ILLNESS:  Mr. [name] is a 45 year old male  admitted to the [facility] on March 27, 2008 for acute mental status  changes.  [name] presented to the emergency department on March 10  with tremor.  He was given an Ativan withdrawal taper, which was  administered by his wife correctly.  However, his father found him on  the day of admission comatose and activated emergency services.  His  wife noted that an Ultram prescription had just been filled at a  pharmacy and she could not find the bottle.  [name] required  intubation after he became severely confused, agitated and combative  when coming out of the severe obtundation.   He had been treated for depression in an outpatient clinic most recently  with Paxil.  His depression continued and Abilify was added 2 days prior  to admission.  [name] told his mother that he wanted to die.  He  also had reported suicidal ideation.  Currently [name] is not  showing any autonomic signs of withdrawal and yet he does continue with  disorientation and intermittent thought disorganization as well as some  clouding of consciousness.  He also has inappropriate smiling.  Please  see the mental status exam.   He does have a drop in his hemoglobin from 13 on March 9 to 9.7  currently while his BUN and creatinine have remained stable.  He has a  long-term history of opioid dependence and has been continuing to use  large  quantities of opiates.   PAST PSYCHIATRIC HISTORY:  [name]' wife describes some nights of the  patient needing very little sleep and staying up out of bed.  She does  not believe these periods have lasted more than 3 days.  [name] does  have a history of depression.   In review of the past medical record, [name] was admitted to the  [facility] center in December 2005.  At that time he was  going through opioid withdrawal and was placed on an opioid withdrawal  protocol.  He also was treated with Lamictal and Seroquel.  His wife  states he has been off of these for years.  Please see the above  history.   FAMILY PSYCHIATRIC HISTORY:  None known.   SOCIAL HISTORY:  [name] does have a college degree in public  administration.  He has worked in [employer].  He is currently  disabled.  He is married with three children.  Please see the above  regarding opioid dependence.  PAST MEDICAL HISTORY:  1. Chronic pancreatitis.  2. Hypertension.   MEDICATIONS:  The MAR is reviewed.  He is on a Fentanyl 25-100 mcg every  1 hour p.r.n.   ALLERGIES:  HE HAS ALLERGIES TO DILANTIN, VIBRAMYCIN, MORPHINE SULFATE  AND SULFA.   LABORATORY DATA:  Please see the above regarding the hemoglobin.  His  BUN on March 9 was 4, creatinine was 0.86.  Current BUN 6, creatinine  0.8.  WBC 7.3, hemoglobin 9.7, platelet count 157.  Sodium 140, glucose 85.  Tylenol, aspirin, ammonia, SGOT, SGPT all unremarkable.  Urine drug  screen positive for benzodiazepines.  Head CT without contrast was  unremarkable.  EKG QTC on March 11:  477 milliseconds.   REVIEW OF SYSTEMS:  [name] cannot provide much of this.  It is  gleaned from his wife, the staff, the medical record and the electronic  medical record.  CONSTITUTIONAL, HEAD, EYES, EARS, NOSE AND THROAT, MOUTH, NEUROLOGIC,  PSYCHIATRIC, CARDIOVASCULAR, RESPIRATORY, GASTROINTESTINAL,  GENITOURINARY, SKIN, MUSCULOSKELETAL,  HEMATOLOGIC, LYMPHATIC, ENDOCRINE,  METABOLIC:  All unremarkable.   EXAMINATION:  VITAL SIGNS:  Temperature 98.3, pulse 97, respiratory rate  21, blood pressure 123/86.  He does not have any tremor or sweating.  Bilateral passive range of motion.  Examination of the elbows:  No  cogwheeling or rigidity.  GENERAL APPEARANCE:  [name] is a young male lying in a supine  position in his intensive care unit bed with no abnormal involuntary  movements.   MENTAL STATUS EXAM:  [name] has clouding of consciousness and  inappropriate smiling.  His affect involves inappropriate smiling.  His  mood is involving inappropriate smiling.  His attention span is  decreased.  Concentration is decreased.  On orientation testing he does  not know the year, the month or the place.  He states that he is at  [facility].  He is oriented to person.  He also recognizes his wife and  daughter.  Memory is impaired.  Fund of knowledge and intelligence are  below that of his estimated premorbid baseline.  His speech is soft with  a slightly flat prosody.  There is no dysarthria.  Thought process  involves intermittent disorganization.  Thought content:  He does not  have any hallucinations.  He currently is not having thoughts of harming  himself or others and he is cooperative with bedside care.  However, his  memory ability is impaired and he is a poor historian.  His insight is  poor.  His judgment is impaired.   ASSESSMENT:  Axis I:  293.00  Delirium not otherwise specified.  If [name] did have  benzodiazepine tolerance and was started on an Ativan taper on the 10th,  he could still go into withdrawal very easily now unless his overdose  involved a benzodiazepine.  Therefore his delirium can be secondary to  other factors.  293.83  Mood disorder not otherwise specified.  [name] does have a  history of depression clearly.  He may also have a history of hypomanic  or manic symptoms.  The history  details are not available at this time  given the patient's poor historical ability.  Opioid dependence, rule out benzodiazepine dependence.  Axis II:  Deferred.  Axis III:  See past medical history.  Axis IV:  General medical.  Axis V:  15.   The undersigned met with the patient's wife to gain as much history from  her as possible.   RECOMMENDATIONS:  1. To  cover the possibility of withdrawal emerging from      benzodiazepines or alcohol, would utilize Ativan 1-3 mg p.o.      intramuscular or intravenous every one hour p.r.n. withdrawal or      agitation.  2. Will not proceed with an antipsychotic yet given the risk of      lowering the seizure threshold if withdrawal is present.  However,      if his delirium symptoms continue tomorrow and there are not      concurrent signs of benzodiazepine or alcohol withdrawal, would      start Zyprexa at 10 mg p.o. or intramuscular every 16:00.  3. Would keep memory and orientation cues in the room and provide low      stimulation ego support with a quiet environment.  4. Would provide thiamine 100 mg daily to cover for the possibility of      nutritional deficiency as a factor in the delirium.  5. The undersigned will check a current hemoglobin to rule out      laboratory error.  6. Given that his delirium etiology is unclear at this point, will      order RPR, B12, folic acid and TSH.  Would consider further organic      workup if delirium persists.  7. Once medically cleared, would admit to an inpatient psychiatric      ward.  8. His long-term therapy will need to involve chemical dependency      rehabilitation as well as the 12-step method.  9. If Zyprexa is started would monitor for stiffness or other      extrapyramidal side effects.      Antonietta Breach, M.D.  Electronically Signed     JW/MEDQ  D:  03/28/2008  T:  03/28/2008  Job:  [id number]

## 2010-06-01 NOTE — H&P (Signed)
NAME:  [name], [name] NO.:  [id number]   MEDICAL RECORD NO.:  [id number]          PATIENT TYPE:  EMS   LOCATION:  MAJO                         FACILITY:  [facility]   PHYSICIAN:  Vania Rea, M.D. DATE OF BIRTH:  [birth year]   DATE OF ADMISSION:  06/27/2008  DATE OF DISCHARGE:                              HISTORY & PHYSICAL   PRIMARY CARE PHYSICIAN:  Dr. Timoteo Gaul at [facility] family practice.   CHIEF COMPLAINT:  Abnormal movements since today.   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  The  patient has been having jerking movements of his arms and legs and  acting strange.  He has not lost consciousness.  During the course of  these abnormal movements, the patient fell and hit his head and  sustained an abrasion to the right periorbital area.   The patient was brought to the emergency room by EMS.  He received 0.5  mg of Dilaudid and 2 mg of Ativan IM and calmed down somewhat.  But has  been waiting in the emergency room for some hours and now has become  agitated again, got up out of  bed walking around the hospital room and  complained of visual hallucinations and a feeling of recurring deja vu.  The patient denies any headache or fever but is complaining of a pain  which seems to be moving from the top of his spine down to his coccyx.  Wife complains that he does not sleep much.   PAST MEDICAL HISTORY:  1. History of ventilator dependent respiratory failure secondary to      acute altered mental status in May 2010.  2. Altered mental status related to possible overdose.  3. History of chronic pancreatitis.  4. Remote history of narcotic addiction and  withdrawal.   MEDICATIONS:  1. Paxil 30 mg twice daily.  2. Neurontin 1800 mg four times daily.  3. The patient briefly took Soma and tramadol for a couple days two      days ago but discontinued.  4. Prilosec 20 mg daily.   ALLERGIES:  DILANTIN, BACTRIM, SULFA, DOXYCYCLINE, MORPHINE, VICODIN AND  ALL NARCOTICS MAKE HIM PRONE TO ADDICTIONS.   SOCIAL HISTORY:  No history of tobacco, alcohol or illicit drug use.  Remote history of narcotic addiction.  He has a college degree in public  administration.  He has worked in [employer].  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   FAMILY HISTORY:  Denies any family history of any medical problems at  all.   REVIEW OF SYSTEMS:  Other than noted above episodic bright red blood  with defecation, otherwisea 10-point review of systems was unremarkable.   PHYSICAL EXAMINATION:  A somewhat  agitated young Caucasian gentleman  standing beside the bed looking somewhat bewildered.  VITALS:  Temperature is 98.0, pulse 110, respirations 20, blood pressure  120/74.  He is saturating at 99% on room air.  His pupils are round, equal and reactive.  There is no nystagmus.  He  has no cervical lymphadenopathy or thyromegaly.  He does have a linear  abrasion across his right eyebrow.  CHEST:  Clear to auscultation bilaterally.  CARDIOVASCULAR:  Regular rhythm.  No murmur heard.  ABDOMEN:  Scaphoid, soft and nontender.  No masses are felt.  EXTREMITIES:  Without edema. He has no calf tenderness.  CENTRAL NERVOUS SYSTEM:  Cranial nerves II-XII appear to be grossly  intact.  He has grade 5 power throughout and has no focal lateralizing  signs.  PSYCHIATRIC:  The patient is alert and oriented x3.   LABORATORY DATA:  White count is 5.2, hemoglobin 9.2, MCV 73.6,  platelets 228.  He has a normal differential on his white count. Sodium  is 140, potassium 4.0, chloride 106, CO2  27, glucose 115, BUN 5,  creatinine 0.89.  Calcium 9.4.  His liver function tests are completely  normal.  His lipase is 16.  His alcohol level is undetectable.  Urine  drug screen is positive for benzodiazepines but he did receive  Versed  en route to the hospital.  CT scan of the brain shows no acute  abnormality.   ASSESSMENT:  1. Acute delirium.  2. Abnormal movements.  3. Chronic pain syndrome.  4. Microcytic anemia.  5. Remote history of opioid addiction.  6. Insomnia.   PLAN:  1. Will admit this gentleman for observation and consultation with a      psychiatrist. It is likely that his symptoms are due to drug      toxicity.  He is taking an extremely large dose of Neurontin as      well as a maximal dose of Paxil.  We will continue his Paxil at 60      mg daily, but will hold or reduce his Neurontin.  2. Will give him Ambien to help with his insomnia.  However, his wife      insisted that he should not be discharged with Ambien and this has      caused a lot of problems at home in the past.  Will also give a      dose of Benadryl in case his abnormal movements are related to      Paxil interaction and will give a tapering dose of benzodiazepines.      We will do an anemia panel, but will go ahead and start him on      iron.  He does give a history of rectal bleeding at toilet. He will      probably need a GI evaluation at a later date.      Vania Rea, M.D.  Electronically Signed     LC/MEDQ  D:  06/27/2008  T:  06/27/2008  Job:  [id number]   cc:   Gelene Mink, M.D.

## 2010-06-01 NOTE — Consult Note (Signed)
NAME:  [name], [name] NO.:  [id number]   MEDICAL RECORD NO.:  [id number]          PATIENT TYPE:  INP   LOCATION:  3030                         FACILITY:  [facility]   PHYSICIAN:  Antonietta Breach, M.D.  DATE OF BIRTH:  14-Jul-1965   DATE OF CONSULTATION:  03/31/2008  DATE OF DISCHARGE:                                 CONSULTATION   [name] denies any suicide attempt.  He states that he did tell his  mother that he wanted to die.  He states that that was a figure of  speech under the influence of nausea and vomiting.   He did report to his primary care physician on Paxil 60 mg daily during  the week prior to admission.  His primary care physician prescribed  Abilify as an antidepressant augmentation.   He emphasizes that his interests were still 50% intact, his energy was  moderately decreased however he did not have any thoughts of harming  himself.  He had no hallucinations or delusions.   He still does have some feeling on edge and has been receiving Ativan  from the nurses.   He emphasizes that he is highly motivated for treatment and ongoing  care, he sites his child as well as his wife and has other interests as  well.  He has constructive future goals.  His energy is still mild to  moderately decreased, he also has mildly depressed interests and had the  same motivation for starting the Abilify that he had the week prior to  admission.   He is socially accepted and cooperative, his thought process is normal.   He does provide additional history regarding his psychotropic problems.  He denies any history of greater than 2 days of increased or decreased  need for sleep and he points out that those periods were associated with  an opioid treatment and they stopped once the opioid was stopped.   He has had multiple episodes of depression and has had multiple trials  for anti-depression including Effexor, Effexor combined with Remeron,  Wellbutrin, Cymbalta,  Seroquel and Lamictal, Paxil has worked the best  with partial improvement.  He was 60 mg Paxil daily prior to admission.   REVIEW OF SYSTEMS:  GASTROINTESTINAL:  He is not having any nausea or  vomiting and feels greatly relieved.   LABORATORY DATA:  Sodium 140, BUN 6, creatinine 0.36, glucose 93, WBC  4.8, hemoglobin 9.7, platelet count 140.  His EKG QTC on March 27, 2008  was 477 milliseconds.   MENTAL STATUS EXAM:  [name] is alert, his eye contact is good, his  attention span is normal, concentration is normal, affect is lightly  restricted at the beginning of the conversation, however, there is a  broad and appropriate range as the conversation proceeds.  Mood is  mildly depressed.  He is oriented to all spheres.  His memory is intact  to immediate, recent and remote except for the obtundation.  His speech  involves normal rate and prosody without dysarthria.  He describes some  anxious moods as well.  Thought process is  logical, coherent, goal-  directed without looseness of associations.  Thought content:  No  thoughts of harming himself, no thoughts of harming others, no delusions  or hallucinations.  Insight is intact, judgment is intact.   ASSESSMENT:  AXIS I:  1. 293.00 delirium not otherwise specified, now clear.  2. 293.83 mood disorder not otherwise specified (idiopathic and      general medical pain factors), depressed, partially improved on      Paxil.  3. 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   [name] is not at risk to harm himself or others.  He agrees to call  emergency services immediately for any thoughts of harming himself or  others or distress.   He agrees to not drive if drowsy.   The undersigned provided ego supportive psychotherapy.   The indications, alternatives and adverse effects of Paxil augmented  with Abilify were discussed with the patient including the risk of  Abilify hyperglycemia, cardiac death and  non-reversible movement  disorders as well as overall metabolic syndrome.   [name] understands and wants to proceed with Paxil augmented with  Abilify given his past history of recurrent major depressive episodes  and non-effective regimen.   RECOMMENDATIONS:  1. Paxil starting at 20 mg q.a.m. and then titrating over the next      seven days to 60 mg p.o. q.a.m.  2. Abilify 5 mg p.o. nightly.  3. For anti acute anxiety would utilize hydroxyzine 25 mg p.o. q.i.d.      p.r.n.   Eventual psychotherapy after the CDIOP that involved cognitive  behavioral therapy with deep breathing and progressive muscle relaxation  can work very well with the Paxil in treating anxiety, reducing the  patient's risk of utilizing benzodiazepines in an abusive manner.   Would ask the social worker to set up a chemical dependency intensive  outpatient program to start immediately during the week of discharge.   [name] declined a psychiatric inpatient dual diagnosis program and  he is not committable.   He is motivated for the CDIOP.   When starting the Abilify would monitor for stiffness or other  extrapyramidal side effects, would perform periodic abnormal involuntary  movement scales and hemoglobin A1c checks as well as checking periodic  lipids, particularly in the text of his pancreatitis history.      Antonietta Breach, M.D.  Electronically Signed     JW/MEDQ  D:  03/31/2008  T:  03/31/2008  Job:  [id number]

## 2010-06-04 NOTE — Discharge Summary (Signed)
NAMEMARWIN, PRIMMER NO.:  [id number]   MEDICAL RECORD NO.:  [id number]          PATIENT TYPE:  IPS   LOCATION:  0303                          [facility]   PHYSICIAN:  Jeanice Lim, M.D. DATE OF BIRTH:  05/16/1965   DATE OF ADMISSION:  12/22/2003  DATE OF DISCHARGE:  12/23/2003                                 DISCHARGE SUMMARY   IDENTIFYING DATA:  This is a 45 year old married male, voluntarily admitted  with a history of opiate dependence, had been taking Fentanyl 100 mcg and  OxyContin 40 mg b.i.d. for approximately one year.  He states he was  prescribed this medication from [facility] for chronic pancreatitis.  He stopped on his own Thursday prior to arrival but was feeling groggy  taking a patch every 2 days as recommended to help decrease pain, but  sweating too much.  He stopped his medications, having tremors, sweats,  increasing anxiety.  Took Xanax to help him through the weekend, not  sleeping well.  Hospitalized at [facility] for chronic pancreatitis  and fleeting suicide ideation.  Dr. Imogene Burn, psychiatrist in [place].  First  admission to [facility].   MEDICATIONS:  Nexium, Ambien, Neurontin and clonidine.   DRUG ALLERGIES:  DILANTIN, GIVES HIVES.   PHYSICAL EXAMINATION:  NEURO:  Within normal limits.   ROUTINE ADMITTED LABORATORY STUDIES:  Within normal limits.   MENTAL STATUS EXAM:  Alert, young male with fair eye contact, somewhat  uncomfortable.  Speech clear, anxious, felt like he was going crazy.  Somewhat anxious and agitated.  Thought processes mostly goal-directed,  perseverating, dysphoria.  Cognitively intact.  Judgment and insight were  fair.  Questionable historian.   ADMISSION DIAGNOSIS:   AXIS I:  1.  Opiate dependence.  Rule out benzodiazepine dependence versus abuse.  2.  Possible depression, not otherwise specified versus substance induced      and withdrawal state mood disorder.   AXIS II:  Deferred.   AXIS III:  Chronic. Pancreatitis.   AXIS IV:  Moderate psychosocial stressors including chronic medical  problems.   AXIS V:  Global assessment of functioning 40/60.   The patient was admitted, ordered routine and p.r.n. medications, underwent  further monitoring, was encouraged to participate in individual, group, and  milieu therapies.  The patient was stabilized on medications.  He was placed  on low-dose Librium detox protocol for possible withdrawal from Xanax and  clonidine detox for safe withdrawal off of pain patches and patient was  resumed on medical meds and psychotropics.  Pain was managed with non-  addictive medication.  Seroquel optimized.  Lamictal started gradually to  minimize risk of rash.  After discussing risk, benefit, ratio and  alternative treatments regarding medications, the patient was discharged in  improved condition.  General response to crisis intervention and medication  optimization, tolerating meds without side effects.  Mood was euthymic.  Affect full.  No dangerous ideation or psychotic symptoms.  Reported  motivation to be compliant with medications.  Aware of risks with  medications and recommendations to stop Lamictal if rash develops and call  psychiatrist.   The patient was discharged on:  1.  Lamictal 25 mg every other day for 2 weeks, then every day with the      recommendation to continue titration as indicated.  2.  Ambien 10 mg one to two q.h.s. p.r.n. insomnia.  3.  Seroquel 100 mg every 9:00 p.m.   The patient was to continue other medications as prescribed.  Avoid  continuing any prescribed narcotics any longer than absolutely necessary due  to high-risk in this particular patient and to work with doctor in gradually  tapering and stopping narcotics.   The patient was to follow up with Dr. Imogene Burn on Wednesday, 12/31/2003, at  11:30.   DISCHARGE DIAGNOSES:   AXIS I:  1.  Opiate dependence.  Rule out benzodiazepine dependence versus  abuse.  2.  Possible depression, not otherwise specified versus substance induced      and withdrawal state mood disorder.   AXIS II:  Deferred.   AXIS III:  Chronic. Pancreatitis.   AXIS IV:  Moderate psychosocial stressors including chronic medical  problems.   AXIS V:  Global assessment of functioning on discharge was 55.     Jame   JEM/MEDQ  D:  01/14/2004  T:  01/14/2004  Job:  [id number]

## 2010-06-04 NOTE — H&P (Signed)
[name], [name] NO.:  [id number]   MEDICAL RECORD NO.:  [id number]          PATIENT TYPE:  IPS   LOCATION:  0303                          [facility]ACILITY[facility]  [facility]   PHYSICIAN:  Jeanice Lim, M.D. DATE OF BIRTH:  Nov 10, 1965   DATE OF ADMISSION:  12/22/2003  DATE OF DISCHARGE:                         PSYCHIATRIC ADMISSION ASSESSMENT   IDENTIFYING INFORMATION:  This is a 45 year old married male, voluntarily  admitted on December 21, 2003.   HISTORY OF PRESENT ILLNESS:  The patient presents with a history of opiate  dependence.  The patient has been taking Fentanyl 100 mcg and OxyContin 40  mg b.i.d. for approximately one year.  The patient states that he was  prescribed this medication from [facility] for his chronic  pancreatitis.  The patient states that he stopped on his own on Thursday  prior to arrival because he was feeling very groggy.  The patient states  that he was taking the patch every two days that was recommended to help  decrease the pain because he sweats so much.  The patient states that after  he stopped his medications, he was having tremors, sweats and increasing  anxiety.  He reports that his wife gave him a few Xanax to help him through  the weekend.  He reports that he has not been sleeping well, has not had an  appetite since Friday.  The patient also reports that he has been  hospitalized approximately two weeks ago in [facility] for chronic  pancreatitis.  The patient is denying any suicidal or homicidal thoughts.   PAST PSYCHIATRIC HISTORY:  First admission to [facility].  No history  of a suicide attempt.  His psychiatrist, Dr. Imogene Burn in [place].   SOCIAL HISTORY:  This is a 45 year old, married, white male.  Married for 17  years; has three children; lives with his wife and children.  He works doing  Holiday representative.  No legal charges.  Is a Engineer, maintenance (IT).   FAMILY HISTORY:  None.   ALCOHOL/DRUG HISTORY:   Nonsmoker.  Denies any alcohol or recreational drug  use.   PRIMARY CARE Geraldo Haris:  Dr. Nanda Quinton at [facility].   PAST MEDICAL HISTORY:  1.  Chronic pancreatitis of unknown etiology.  2.  Hypertension.   MEDICATIONS:  1.  Nexium 40 mg daily.  2.  Ambien 10 at bedtime.  3.  Neurontin 600 mg t.i.d.  4.  Clonidine 0.2 one in the morning and one at bedtime.   DRUG ALLERGIES:  DILANTIN, reports he gets hives.   PHYSICAL EXAMINATION:  The patient was assessed at [facility] where he  received Ativan 4 mg p.o., Valium 10 mg p.o. and Ativan 2 mg IM.  He seemed  somewhat uncomfortable.  His temperature is 98.1; 72 heart rate; 18  respirations; blood pressure 133/72; he is 169 pounds.  His urine drug  screen is positive for benzodiazepines and barbiturates.  Glucose is 106,  RDW is 18, amylase is 114.   MENTAL STATUS EXAM:  He is an alert, young man.  Fair eye contact.  Seems to  be somewhat  uncomfortable.  Speech is clear.  Patient feels anxious, feels  like he is going to go crazy.  In fact, he gets somewhat anxious.  Thought  processes are coherent with no evidence of psychosis.  Cognitive function is  intact.  Memory is good.  Judgment and insight are fair.  Questionable  historian.   ADMISSION DIAGNOSES:   AXIS I:  1.  Opiate abuse.  2.  Rule out benzodiazepine abuse.   AXIS II:  Deferred.   AXIS III:  Chronic pancreatitis, per patient.   AXIS IV:  Other psychosocial problems, medical problems.   AXIS V:  Current is 45; past year is 28.   PLAN:  Contract for safety, stabilize mood and thinking.  We will detox the  patient from opiates by using his clonidine.  We will have Librium available  for anxiety.  We will encourage fluids.  We will monitor patient's vital  signs.  The patient is to follow-up with Dr. Imogene Burn and [facility] for  continuing follow-up of his chronic pancreatitis.   TENTATIVE LENGTH OF STAY:  Three to four days.     [name]   JO/MEDQ  D:  12/22/2003  T:   12/22/2003  Job:  [id number]

## 2010-07-01 ENCOUNTER — Ambulatory Visit: Payer: Self-pay | Admitting: Unknown Physician Specialty

## 2010-07-18 ENCOUNTER — Ambulatory Visit: Payer: Self-pay | Admitting: Unknown Physician Specialty

## 2010-07-21 ENCOUNTER — Emergency Department (HOSPITAL_COMMUNITY): Payer: BC Managed Care – PPO

## 2010-07-21 ENCOUNTER — Emergency Department (HOSPITAL_COMMUNITY)
Admission: EM | Admit: 2010-07-21 | Discharge: 2010-07-21 | Disposition: A | Discharge status: Home / Self Care | Payer: BC Managed Care – PPO | Attending: Emergency Medicine | Admitting: Emergency Medicine

## 2010-07-21 DIAGNOSIS — K9429 Other complications of gastrostomy: Secondary | ICD-10-CM | POA: Insufficient documentation

## 2010-07-21 DIAGNOSIS — K219 Gastro-esophageal reflux disease without esophagitis: Secondary | ICD-10-CM | POA: Insufficient documentation

## 2010-07-21 DIAGNOSIS — G40909 Epilepsy, unspecified, not intractable, without status epilepticus: Secondary | ICD-10-CM | POA: Insufficient documentation

## 2010-07-21 DIAGNOSIS — Z79899 Other long term (current) drug therapy: Secondary | ICD-10-CM | POA: Insufficient documentation

## 2010-07-21 DIAGNOSIS — R109 Unspecified abdominal pain: Secondary | ICD-10-CM | POA: Insufficient documentation

## 2010-07-21 DIAGNOSIS — Y838 Other surgical procedures as the cause of abnormal reaction of the patient, or of later complication, without mention of misadventure at the time of the procedure: Secondary | ICD-10-CM | POA: Insufficient documentation

## 2010-07-22 ENCOUNTER — Ambulatory Visit (HOSPITAL_COMMUNITY)
Admission: RE | Admit: 2010-07-22 | Discharge: 2010-07-22 | Disposition: A | Discharge status: Home / Self Care | Payer: BC Managed Care – PPO | Source: Ambulatory Visit | Attending: Interventional Radiology | Admitting: Interventional Radiology

## 2010-07-22 ENCOUNTER — Other Ambulatory Visit (HOSPITAL_COMMUNITY): Payer: Self-pay | Admitting: Interventional Radiology

## 2010-07-22 DIAGNOSIS — Z434 Encounter for attention to other artificial openings of digestive tract: Secondary | ICD-10-CM | POA: Insufficient documentation

## 2010-08-18 ENCOUNTER — Ambulatory Visit: Payer: Self-pay | Admitting: Unknown Physician Specialty

## 2010-10-18 LAB — URINALYSIS, ROUTINE W REFLEX MICROSCOPIC
Bilirubin Urine: NEGATIVE
Bilirubin Urine: NEGATIVE
Bilirubin Urine: NEGATIVE
Glucose, UA: NEGATIVE
Glucose, UA: NEGATIVE
Glucose, UA: NEGATIVE
Hgb urine dipstick: NEGATIVE
Hgb urine dipstick: NEGATIVE
Ketones, ur: NEGATIVE
Ketones, ur: NEGATIVE
Nitrite: NEGATIVE
Nitrite: NEGATIVE
Nitrite: NEGATIVE
Protein, ur: 30 — AB
Protein, ur: NEGATIVE
Protein, ur: 30 — AB
Protein, ur: NEGATIVE
Specific Gravity, Urine: 1.008
Specific Gravity, Urine: 1.029
Specific Gravity, Urine: 1.016
Specific Gravity, Urine: 1.023
Urobilinogen, UA: 0.2
Urobilinogen, UA: 1
Urobilinogen, UA: 0.2
pH: 6
pH: 6
pH: 6
pH: 6

## 2010-10-18 LAB — URINE MICROSCOPIC-ADD ON

## 2010-10-18 LAB — BASIC METABOLIC PANEL
CO2: 22
Calcium: 9.1
Creatinine, Ser: 0.95
GFR calc Af Amer: >60
GFR calc non Af Amer: >60
Glucose, Bld: 91
Sodium: 135

## 2010-10-18 LAB — POCT I-STAT, CHEM 8
BUN: 10
Creatinine, Ser: 0.8
Glucose, Bld: 88
Potassium: 3.8
Sodium: 142

## 2010-10-19 LAB — URINALYSIS, ROUTINE W REFLEX MICROSCOPIC
Bilirubin Urine: NEGATIVE
Hgb urine dipstick: NEGATIVE
Specific Gravity, Urine: 1.009
pH: 6

## 2010-10-19 LAB — DIFFERENTIAL
Basophils Absolute: 0
Basophils Relative: 1
Eosinophils Absolute: 0.7
Eosinophils Relative: 12 — ABNORMAL HIGH
Lymphocytes Relative: 39
Lymphs Abs: 2.3
Monocytes Absolute: 0.5
Monocytes Relative: 9
Neutro Abs: 2.3
Neutrophils Relative %: 40 — ABNORMAL LOW

## 2010-10-19 LAB — URINE MICROSCOPIC-ADD ON

## 2010-10-19 LAB — CBC
HCT: 37.1 — ABNORMAL LOW
Hemoglobin: 12.2 — ABNORMAL LOW
MCHC: 32.9
MCV: 84
Platelets: 221
RBC: 4.42
RDW: 16.1 — ABNORMAL HIGH
WBC: 5.8

## 2010-10-19 LAB — POCT I-STAT, CHEM 8
BUN: 9
Creatinine, Ser: 1.3
Glucose, Bld: 93
Potassium: 3.5
Sodium: 140
TCO2: 30

## 2010-10-22 LAB — DIFFERENTIAL
Basophils Absolute: 0 10*3/uL (ref 0.0–0.1)
Basophils Relative: 1 % (ref 0–1)
Eosinophils Relative: 8 % — ABNORMAL HIGH (ref 0–5)
Lymphocytes Relative: 30 % (ref 12–46)
Lymphs Abs: 2.1 10*3/uL (ref 0.7–4.0)
Neutro Abs: 3.8 10*3/uL (ref 1.7–7.7)
Neutrophils Relative %: 60 % (ref 43–77)

## 2010-10-22 LAB — URINALYSIS, ROUTINE W REFLEX MICROSCOPIC
Bilirubin Urine: NEGATIVE
Glucose, UA: NEGATIVE mg/dL
Specific Gravity, Urine: 1.024 (ref 1.005–1.030)

## 2010-10-22 LAB — URINE MICROSCOPIC-ADD ON

## 2010-10-22 LAB — CBC
HCT: 35.2 % — ABNORMAL LOW (ref 39.0–52.0)
Hemoglobin: 11.6 g/dL — ABNORMAL LOW (ref 13.0–17.0)
MCHC: 33.2 g/dL (ref 30.0–36.0)
MCV: 85.6 fL (ref 78.0–100.0)
Platelets: 225 10*3/uL (ref 150–400)
RDW: 16.5 % — ABNORMAL HIGH (ref 11.5–15.5)
RDW: 17.2 % — ABNORMAL HIGH (ref 11.5–15.5)

## 2010-10-22 LAB — COMPREHENSIVE METABOLIC PANEL
AST: 18 U/L (ref 0–37)
Albumin: 3.8 g/dL (ref 3.5–5.2)
Alkaline Phosphatase: 83 U/L (ref 39–117)
BUN: 8 mg/dL (ref 6–23)
CO2: 28 mEq/L (ref 19–32)
Calcium: 9 mg/dL (ref 8.4–10.5)
Chloride: 101 mEq/L (ref 96–112)
Creatinine, Ser: 0.83 mg/dL (ref 0.4–1.5)
GFR calc Af Amer: >60 mL/min (ref >60)
GFR calc non Af Amer: >60 mL/min (ref >60)
GFR calc non Af Amer: >60 mL/min (ref >60)
Glucose, Bld: 123 mg/dL — ABNORMAL HIGH (ref 70–99)
Potassium: 3 mEq/L — ABNORMAL LOW (ref 3.5–5.1)
Total Bilirubin: 0.9 mg/dL (ref 0.3–1.2)
Total Protein: 5.8 g/dL — ABNORMAL LOW (ref 6.0–8.3)
Total Protein: 6.1 g/dL (ref 6.0–8.3)

## 2010-10-22 LAB — LIPASE, BLOOD
Lipase: 14 U/L (ref 11–59)
Lipase: 14 U/L (ref 11–59)

## 2010-10-28 LAB — DIFFERENTIAL
Basophils Relative: 1
Eosinophils Absolute: 0
Eosinophils Relative: 1
Monocytes Absolute: 0.3
Monocytes Relative: 8
Neutrophils Relative %: 66

## 2010-10-28 LAB — RAPID URINE DRUG SCREEN, HOSP PERFORMED
Cocaine: NOT DETECTED
Tetrahydrocannabinol: NOT DETECTED

## 2010-10-28 LAB — COMPREHENSIVE METABOLIC PANEL
ALT: 16
AST: 14
Alkaline Phosphatase: 71
GFR calc Af Amer: >60
Glucose, Bld: 106 — ABNORMAL HIGH
Potassium: 3.4 — ABNORMAL LOW
Sodium: 142
Total Protein: 6.7

## 2010-10-28 LAB — URINALYSIS, ROUTINE W REFLEX MICROSCOPIC
Protein, ur: 30 — AB
Urobilinogen, UA: 1

## 2010-10-28 LAB — URINE MICROSCOPIC-ADD ON

## 2010-10-28 LAB — CBC
Hemoglobin: 10.6 — ABNORMAL LOW
RBC: 4.01 — ABNORMAL LOW
RDW: 18 — ABNORMAL HIGH

## 2010-10-28 LAB — ETHANOL: Alcohol, Ethyl (B): <5

## 2010-10-28 LAB — LIPASE, BLOOD: Lipase: 13

## 2010-12-14 ENCOUNTER — Ambulatory Visit: Payer: Self-pay | Admitting: Unknown Physician Specialty

## 2010-12-18 ENCOUNTER — Ambulatory Visit: Payer: Self-pay | Admitting: Unknown Physician Specialty

## 2011-02-08 ENCOUNTER — Ambulatory Visit: Payer: Self-pay | Admitting: Unknown Physician Specialty

## 2011-09-16 ENCOUNTER — Inpatient Hospital Stay: Payer: Self-pay | Admitting: Psychiatry

## 2011-09-16 LAB — URINALYSIS, COMPLETE
Ketone: NEGATIVE
Nitrite: NEGATIVE
Ph: 6 (ref 4.5–8.0)
Protein: 30
Specific Gravity: 1.013 (ref 1.003–1.030)
WBC UR: 2 /HPF (ref 0–5)

## 2011-09-16 LAB — DRUG SCREEN, URINE
Cannabinoid 50 Ng, Ur ~~LOC~~: NEGATIVE (ref ?–50)
Cocaine Metabolite,Ur ~~LOC~~: POSITIVE (ref ?–300)
MDMA (Ecstasy)Ur Screen: POSITIVE (ref ?–500)
Opiate, Ur Screen: NEGATIVE (ref ?–300)
Tricyclic, Ur Screen: NEGATIVE (ref ?–1000)

## 2011-09-16 LAB — TSH: Thyroid Stimulating Horm: 2.6 u[IU]/mL

## 2011-09-16 LAB — COMPREHENSIVE METABOLIC PANEL
Bilirubin,Total: 0.4 mg/dL (ref 0.2–1.0)
Chloride: 106 mmol/L (ref 98–107)
Co2: 26 mmol/L (ref 21–32)
Creatinine: 1.27 mg/dL (ref 0.60–1.30)
EGFR (African American): >60
EGFR (Non-African Amer.): >60
Osmolality: 278 (ref 275–301)
Sodium: 140 mmol/L (ref 136–145)

## 2011-09-16 LAB — CBC
Platelet: 198 10*3/uL (ref 150–440)
RBC: 5.29 10*6/uL (ref 4.40–5.90)
WBC: 4.8 10*3/uL (ref 3.8–10.6)

## 2011-12-09 ENCOUNTER — Emergency Department: Payer: Self-pay | Admitting: Emergency Medicine

## 2011-12-09 LAB — URINALYSIS, COMPLETE
Bacteria: NONE SEEN
Glucose,UR: NEGATIVE mg/dL (ref 0–75)
RBC,UR: 180 /HPF (ref 0–5)
Specific Gravity: 1.024 (ref 1.003–1.030)
Squamous Epithelial: <1
WBC UR: 16 /HPF (ref 0–5)

## 2011-12-09 LAB — CBC
HCT: 44.3 % (ref 40.0–52.0)
HGB: 15.1 g/dL (ref 13.0–18.0)
MCV: 90 fL (ref 80–100)
RBC: 4.91 10*6/uL (ref 4.40–5.90)
RDW: 14.9 % — ABNORMAL HIGH (ref 11.5–14.5)
WBC: 6.7 10*3/uL (ref 3.8–10.6)

## 2011-12-09 LAB — BASIC METABOLIC PANEL
BUN: 12 mg/dL (ref 7–18)
Chloride: 109 mmol/L — ABNORMAL HIGH (ref 98–107)
Co2: 29 mmol/L (ref 21–32)
Creatinine: 1.11 mg/dL (ref 0.60–1.30)
Potassium: 4.6 mmol/L (ref 3.5–5.1)
Sodium: 144 mmol/L (ref 136–145)

## 2014-03-30 IMAGING — CT CT STONE STUDY
1 of 2 series · 15 of 32 positions shown, 19 images · non-contrast
Comparison: none

REASON FOR EXAM: left flank pain and hematuria
COMMENTS:

PROCEDURE:     CT  - CT ABDOMEN /PELVIS WO (STONE)  - December 09, 2011  [DATE]
RESULT:     Comparison: None
TECHNIQUE: Multiple axial images from the lung bases to the symphysis pubis
were obtained without oral and without intravenous contrast.

[Series 2: 3mm soft tissue · axial · 0.72mm/px · z∈[-170,+238]mm · 15 of 150 slices shown, 19 images]
[im 7/150  soft-tissue]
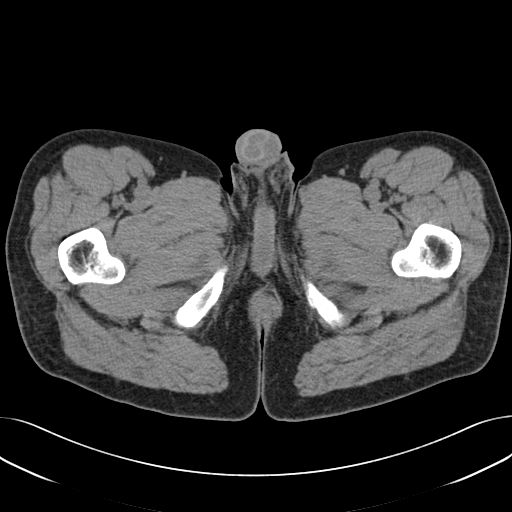
[im 7/150  bone]
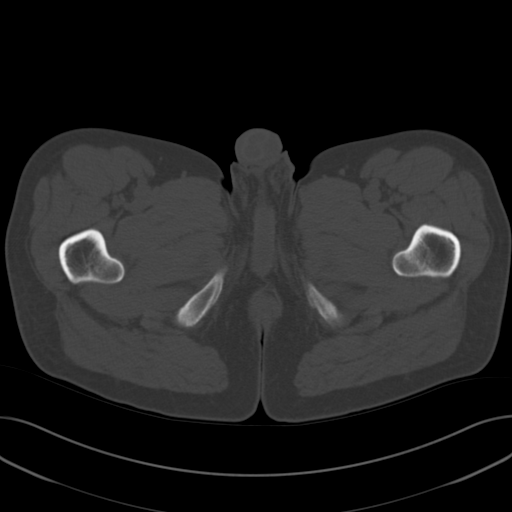
[im 19/150  soft-tissue]
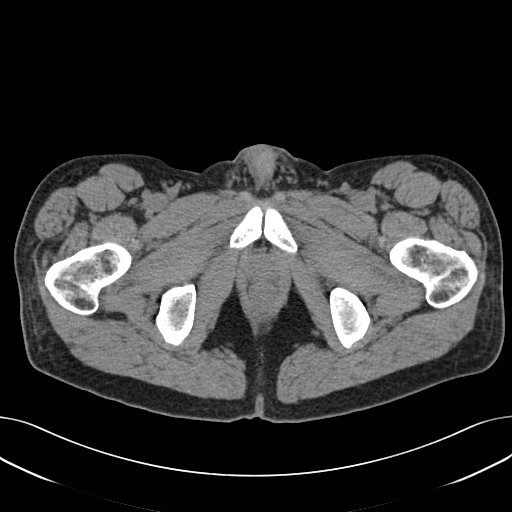
[im 32/150  soft-tissue]
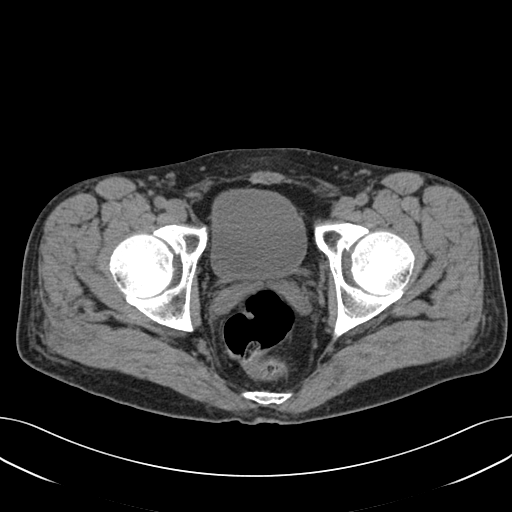
[im 44/150  soft-tissue]
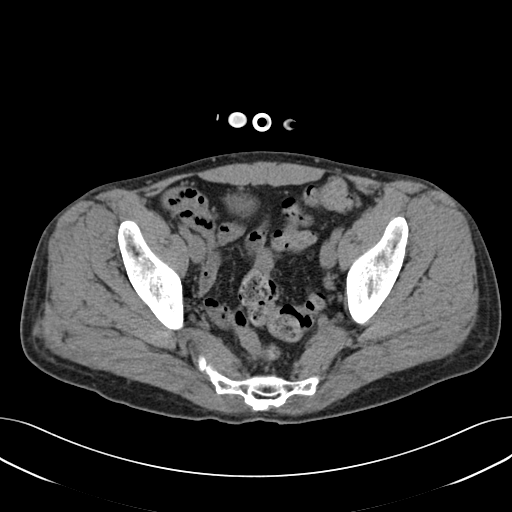
[im 50/150  soft-tissue]
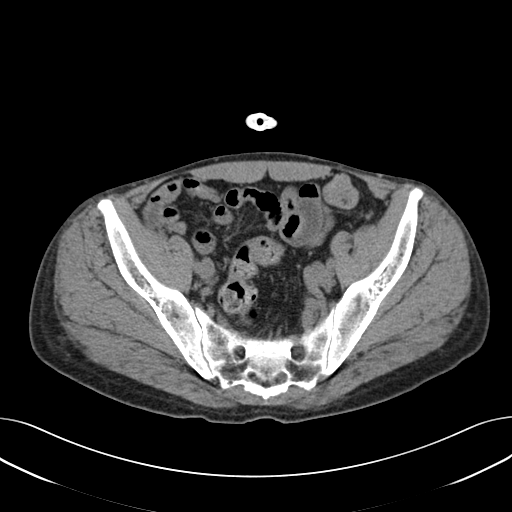
[im 63/150  soft-tissue]
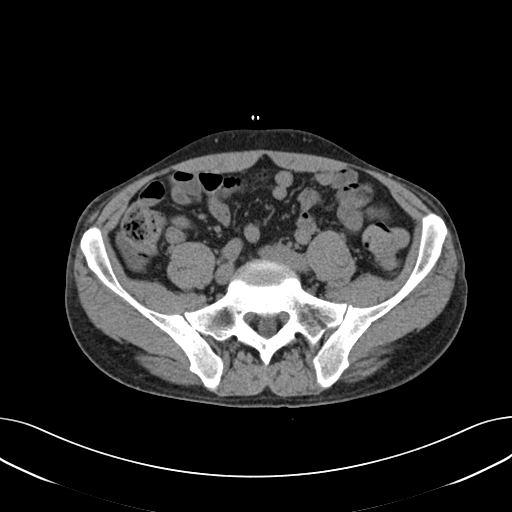
[im 75/150  soft-tissue]
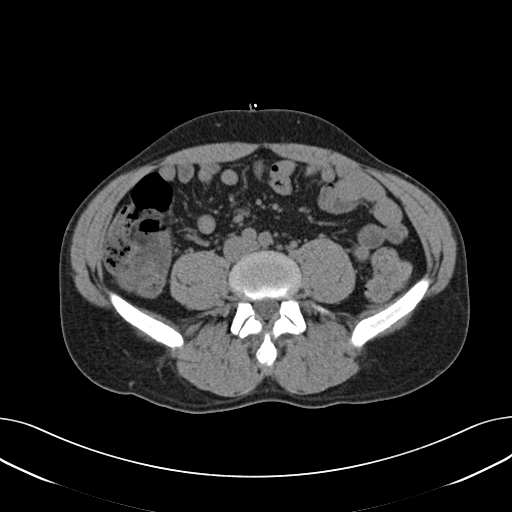
[im 87/150  soft-tissue]
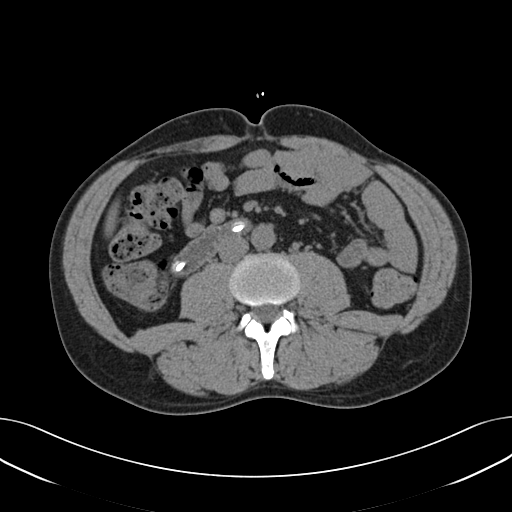
[im 100/150  soft-tissue]
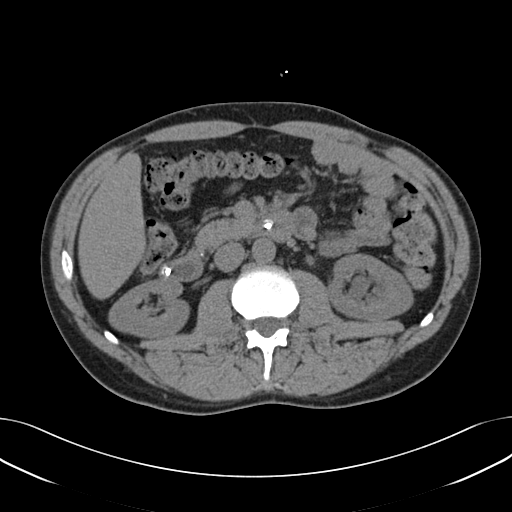
[im 100/150  bone]
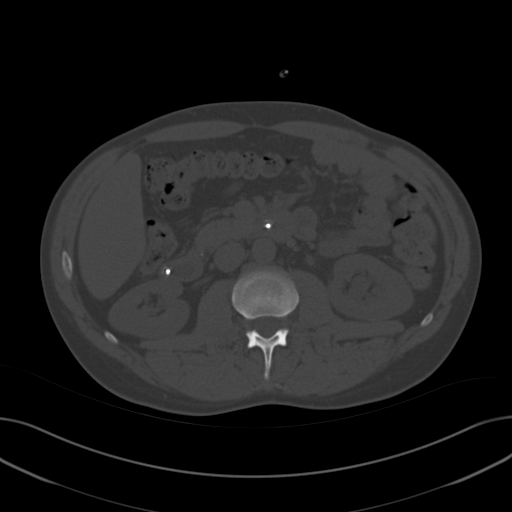
[im 106/150  soft-tissue]
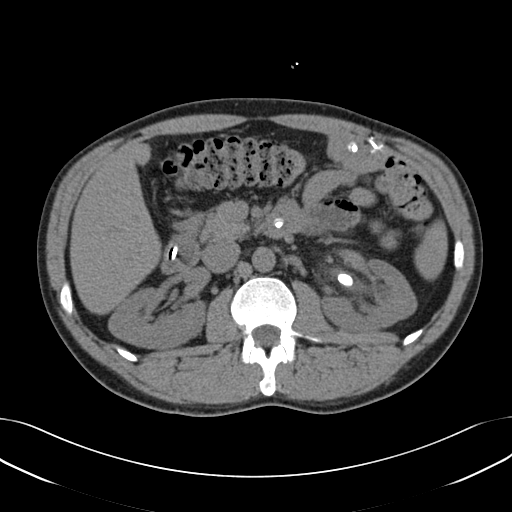
[im 118/150  soft-tissue]
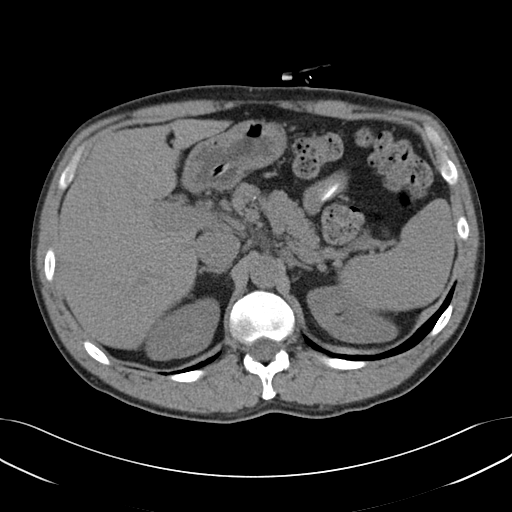
[im 125/150  lung]
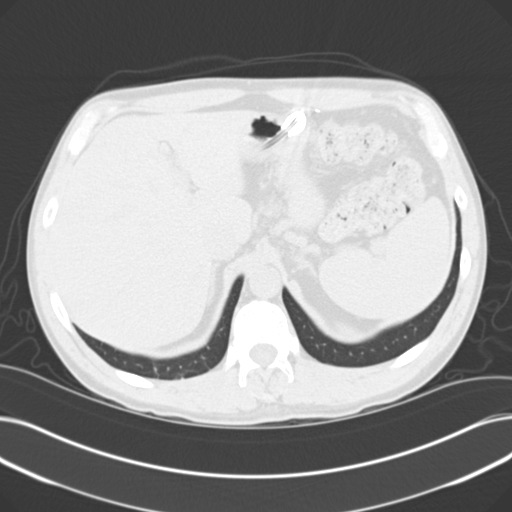
[im 131/150  soft-tissue]
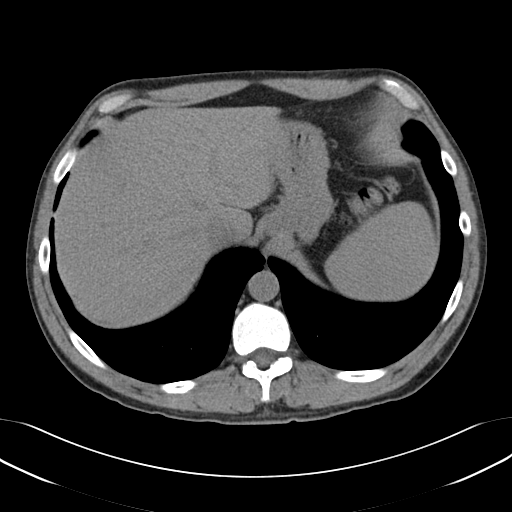
[im 131/150  lung]
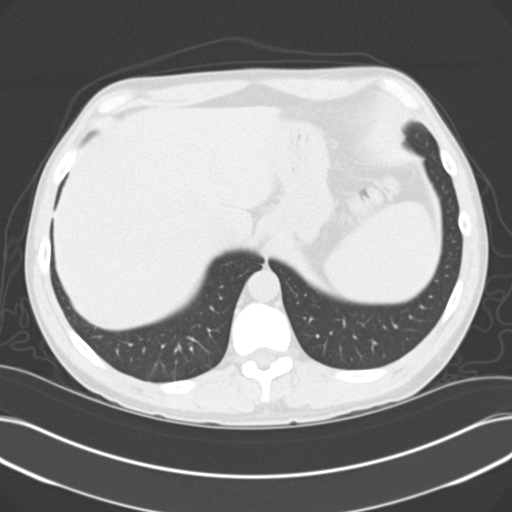
[im 137/150  lung]
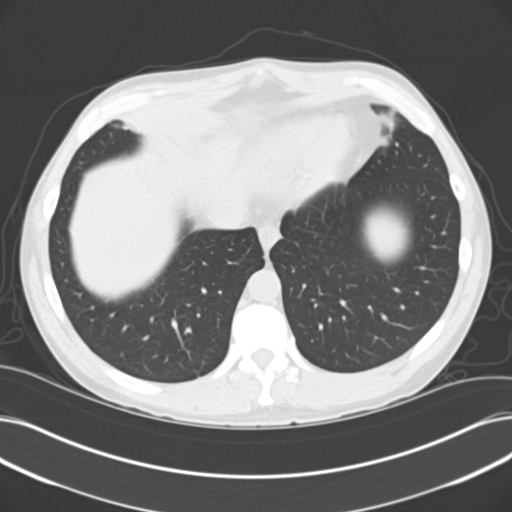
[im 143/150  soft-tissue]
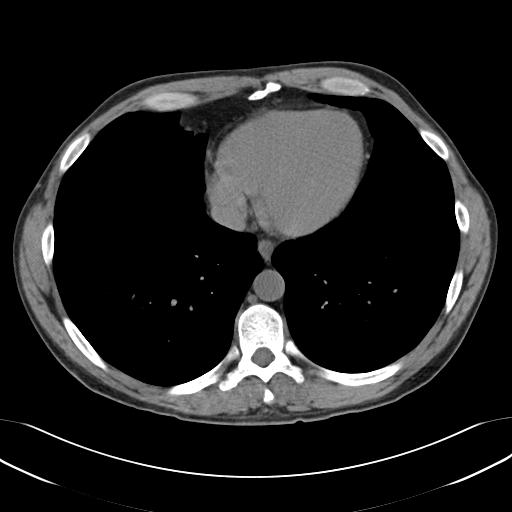
[im 143/150  lung]
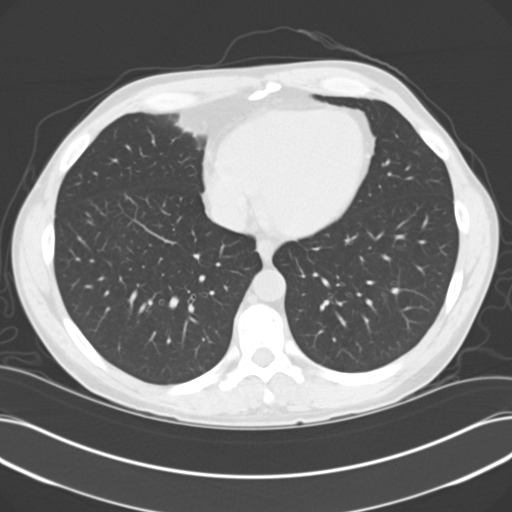

[15 of 32 positions shown; findings below may reference images not displayed]

FINDINGS: Lack of intravenous contrast limits evaluation of the solid abdominal
organs.  Grossly, the liver, spleen, and adrenals are unremarkable. There is
a punctate calcification in the pancreatic body, which is nonspecific. A
gastrojejunostomy tube is present. Surgical clips are seen from prior
cholecystectomy.

There is a 1.1 cm calculus in the left renal pelvis. There is mild left
hydronephrosis. There is a small calculus in the inferior pole of the left
kidney. No calculi seen in the right kidney.

The small and large bowel are normal in caliber. Surgical clips are seen in
the anterior left lateral abdomen. The appendix is normal.

No aggressive lytic or sclerotic osseous lesions are identified.
IMPRESSION: There is a 1.1 cm calculus in the left renal pelvis causing mild left
hydronephrosis.
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
## 2014-05-06 NOTE — Discharge Summary (Signed)
PATIENT NAME:  [name], Kino W MR#:  [id number] DATE OF BIRTH:  09/26/1965  DATE OF ADMISSION:  09/16/2011 DATE OF DISCHARGE:  09/21/2011  HOSPITAL COURSE: See dictated history and physical for details of admission. This is a 49 year old man with a history of substance dependence and depression who was admitted involuntarily after becoming engaged in a fight with his family while he was intoxicated. He gave a history of recent heavy abuse of crack cocaine along with multiple symptoms of depression. The patient was agreeable to starting back on medication for depression. He did not require any medical treatment for detox. He was physically stable. At no time in the hospital did he engage in any dangerous or aggressive behavior and he consistently denied suicidal and homicidal ideation. The patient showed limited insight about his substance abuse problem. He was offered the opportunity to [facility] [place] or even go to the Intensive Outpatient Program at our hospital. The patient declined all of this. He said that his plan was to simply stop using drugs and that he felt motivated to do it. His excuse was that he had already been to multiple rehab programs so it did not seem that it made sense to him to do any further treatment. He was educated about the nature of substance abuse problems and the importance to continue persistence at staying sober. The patient did not appear to be presenting any acute threat to himself or others. At the time of discharge he did not appear to be likely to benefit from further inpatient treatment and was requesting discharge. He was discharged home with his family with follow-up for medication management locally with local mental health authorities, probably with [facility].   DISCHARGE MEDICATIONS:  1. Paxil 40 mg p.o. daily.  2. Pancrelipase 12,000 units with each meal.  3. Neurontin 800 mg 4 times a day.  4. Ambien 10 mg at night.   5. Wellbutrin SR 150 mg in the morning and 150 mg at noon.   RESULTS OF LABORATORY TESTS: Drug screen on admission positive for cocaine, MDMA, benzodiazepines. Urinalysis positive for protein, doubtful infection. TSH normal. Alcohol 11. Chemistries normal. CBC normal.   MENTAL STATUS EXAM AT DISCHARGE: Still somewhat disheveled man who looks his stated age. Cooperative but passive in the interview. Eye contact normal. Psychomotor activity limited. Speech limited in amount and tone. Affect a little bit blunted. Mood stated as good. Thoughts appear to be lucid and directed with no evidence of loosening of associations or delusional thinking. Denies hallucinations. Denies suicidal or homicidal ideation. Average intelligence, somewhat impaired judgment and insight specifically about his substance abuse problem. Short and longer-term memory grossly intact.   DIAGNOSES PRINCIPLE AND PRIMARY:  AXIS I: Depression, not otherwise specified.   SECONDARY DIAGNOSES:  AXIS I:  1. Cocaine dependence.  2. Polysubstance dependence.   AXIS II: Deferred.   AXIS III: Chronic pancreatitis requiring feeding through G tube much of the time or orally with pancrelipase.   AXIS IV: Severe from his medical problems, his ongoing substance abuse, and his conflict with his family.   AXIS V: Functioning at time of discharge 57.   ____________________________ Audery AmelJohn T. Leatrice Parilla, MD [email or web address] D: 09/21/2011 23:53:56 ET T: 09/23/2011 11:39:53 ET JOB#: [id number]  cc: Audery AmelJohn T. Daven Pinckney, MD, <Dictator> Audery AmelJOHN T Aleya Durnell MD ELECTRONICALLY SIGNED 09/23/2011 12:34

## 2014-11-18 DEATH — deceased
